# Patient Record
Sex: Male | Born: 1985 | Race: White | Hispanic: No | Marital: Married | Smoking: Never smoker
Health system: Southern US, Community
[De-identification: ages and names within clinical notes are randomized; demographics above are authoritative.]

## PROBLEM LIST (undated history)

## (undated) DIAGNOSIS — T7840XA Allergy, unspecified, initial encounter: Secondary | ICD-10-CM

## (undated) HISTORY — DX: Allergy, unspecified, initial encounter: T78.40XA

## (undated) HISTORY — PX: ROTATOR CUFF REPAIR: SHX139

---

## 2017-03-28 ENCOUNTER — Other Ambulatory Visit: Payer: Self-pay | Admitting: Family Medicine

## 2017-03-28 ENCOUNTER — Ambulatory Visit
Admission: RE | Admit: 2017-03-28 | Discharge: 2017-03-28 | Disposition: A | Discharge status: Home / Self Care | Payer: Managed Care, Other (non HMO) | Source: Ambulatory Visit | Attending: Family Medicine | Admitting: Family Medicine

## 2017-03-28 DIAGNOSIS — M5416 Radiculopathy, lumbar region: Secondary | ICD-10-CM

## 2018-06-04 ENCOUNTER — Ambulatory Visit (HOSPITAL_COMMUNITY)
Admission: EM | Admit: 2018-06-04 | Discharge: 2018-06-04 | Disposition: A | Discharge status: Home / Self Care | Payer: Managed Care, Other (non HMO) | Attending: Physician Assistant | Admitting: Physician Assistant

## 2018-06-04 ENCOUNTER — Encounter (HOSPITAL_COMMUNITY): Payer: Self-pay

## 2018-06-04 DIAGNOSIS — M79672 Pain in left foot: Secondary | ICD-10-CM

## 2018-06-04 MED ORDER — PREDNISONE 50 MG PO TABS: 50.0000 mg | ORAL_TABLET | Freq: Every day | ORAL | 0 refills | Status: DC

## 2018-06-04 NOTE — ED Provider Notes (Signed)
[facility]    CSN: [id number] Arrival date & time: 06/04/18  [id number]     History   Chief Complaint Chief Complaint  Patient presents with  . Foot Pain    right foot pain     HPI Kevin NajjarBrian Moon is a 32 y.o. male.   32 year old male comes in for 2 day history of left foot pain. States pain is to the plantar surface, around the arch. Denies pain at rest. Pain exacerbated by weight bearing and movement. Denies injury/trauma. States restarted running regimen recently. States yesterday, woke up with significant pain with weight bearing in the morning. Denies numbness/tingling. Ibuprofen 800mg  with some relief.      History reviewed. No pertinent past medical history.  There are no active problems to display for this patient.   History reviewed. No pertinent surgical history.     Home Medications    Prior to Admission medications   Medication Sig Start Date End Date Taking? Authorizing Provider  predniSONE (DELTASONE) 50 MG tablet Take 1 tablet (50 mg total) by mouth daily. 06/04/18   Belinda FisherYu, Myleah Cavendish V, PA-C    Family History History reviewed. No pertinent family history.  Social History Social History   Tobacco Use  . Smoking status: Not on file  Substance Use Topics  . Alcohol use: Not on file  . Drug use: Not on file     Allergies   Patient has no known allergies.   Review of Systems Review of Systems  Reason unable to perform ROS: See HPI as above.     Physical Exam Triage Vital Signs ED Triage Vitals  Enc Vitals Group     BP 06/04/18 1046 126/82     Pulse Rate 06/04/18 1046 64     Resp 06/04/18 1046 18     Temp 06/04/18 1046 98 F (36.7 C)     Temp Source 06/04/18 1046 Oral     SpO2 06/04/18 1046 100 %     Weight --      Height --      Head Circumference --      Peak Flow --      Pain Score 06/04/18 1049 7     Pain Loc --      Pain Edu? --      Excl. in GC? --    No data found.  Updated Vital Signs BP 126/82 (BP Location: Right  Arm)   Pulse 64   Temp 98 F (36.7 C) (Oral)   Resp 18   SpO2 100%    Physical Exam Constitutional:      General: He is not in acute distress.    Appearance: He is well-developed. He is not diaphoretic.  HENT:     Head: Normocephalic and atraumatic.  Eyes:     Conjunctiva/sclera: Conjunctivae normal.     Pupils: Pupils are equal, round, and reactive to light.  Musculoskeletal:     Comments: No swelling, erythema, warmth, contusion. Tenderness to palpation of plantar arch. Full ROM of ankle and toes. Symptoms worse with dorsiflexion. Strength normal and equal bilaterally. Sensation intact and equal bilaterally. Pedal pulse 2+, cap refill <2s  Neurological:     Mental Status: He is alert and oriented to person, place, and time.      UC Treatments / Results  Labs (all labs ordered are listed, but only abnormal results are displayed) Labs Reviewed - No data to display  EKG None  Radiology No results found.  Procedures  Procedures (including critical care time)  Medications Ordered in UC Medications - No data to display  Initial Impression / Assessment and Plan / UC Course  I have reviewed the triage vital signs and the nursing notes.  Pertinent labs & imaging results that were available during my care of the patient were reviewed by me and considered in my medical decision making (see chart for details).    Continue NSAIDs, ice compress, elevation, rest. Rx of prednisone provided, can fill if symptoms not improving with NSAIDs. Return precautions given.  Final Clinical Impressions(s) / UC Diagnoses   Final diagnoses:  Left foot pain    ED Prescriptions    Medication Sig Dispense Auth. Provider   predniSONE (DELTASONE) 50 MG tablet Take 1 tablet (50 mg total) by mouth daily. 5 tablet Threasa AlphaYu, Ova Gillentine V, PA-C        Fidel Caggiano V, [place] JerseyPA-C 06/04/18 1137

## 2018-06-04 NOTE — Discharge Instructions (Addendum)
Symptoms consistent with inflammation such as tendinitis/plantar fascitis. Continue ibuprofen 800mg  three times a day, ice compress, stretching out fascia with water bottle, rest. If symptoms not improving, start prednisone as directed. Follow up with orthopedics if symptoms still not improving.

## 2018-06-04 NOTE — ED Triage Notes (Signed)
Pt presents right foot pain that started on Sunday.  Pt denies any injury, he did go running on Saturday morning and on Sunday he woke up and was unable to walk on his right foot.

## 2018-12-16 ENCOUNTER — Emergency Department (HOSPITAL_COMMUNITY)
Admission: EM | Admit: 2018-12-16 | Discharge: 2018-12-16 | Disposition: A | Discharge status: Home / Self Care | Payer: Managed Care, Other (non HMO) | Attending: Emergency Medicine | Admitting: Emergency Medicine

## 2018-12-16 ENCOUNTER — Encounter (HOSPITAL_COMMUNITY): Payer: Self-pay | Admitting: Student

## 2018-12-16 ENCOUNTER — Other Ambulatory Visit: Payer: Self-pay

## 2018-12-16 ENCOUNTER — Emergency Department (HOSPITAL_COMMUNITY): Payer: Managed Care, Other (non HMO)

## 2018-12-16 DIAGNOSIS — R05 Cough: Secondary | ICD-10-CM | POA: Diagnosis not present

## 2018-12-16 DIAGNOSIS — U071 COVID-19: Secondary | ICD-10-CM | POA: Diagnosis not present

## 2018-12-16 DIAGNOSIS — B349 Viral infection, unspecified: Secondary | ICD-10-CM

## 2018-12-16 DIAGNOSIS — M7918 Myalgia, other site: Secondary | ICD-10-CM | POA: Diagnosis not present

## 2018-12-16 DIAGNOSIS — R509 Fever, unspecified: Secondary | ICD-10-CM | POA: Diagnosis present

## 2018-12-16 MED ORDER — BENZONATATE 100 MG PO CAPS: 100.0000 mg | ORAL_CAPSULE | Freq: Three times a day (TID) | ORAL | 0 refills | Status: DC

## 2018-12-16 MED ORDER — FLUTICASONE PROPIONATE 50 MCG/ACT NA SUSP: 1.0000 | Freq: Every day | NASAL | 0 refills | Status: DC

## 2018-12-16 MED ORDER — ACETAMINOPHEN ER 650 MG PO TBCR: 650.0000 mg | EXTENDED_RELEASE_TABLET | Freq: Three times a day (TID) | ORAL | 0 refills | Status: DC | PRN

## 2018-12-16 NOTE — ED Provider Notes (Signed)
[facility] Provider Note   CSN: [id number] Arrival date & time: 12/16/18  0840     History   Chief Complaint Chief Complaint  Patient presents with  . Fever    HPI Kevin Moon is a 33 y.o. male without significant past medical hx who presents to the ED w/ complaints of fever that began this AM. Patient states he started to feel poorly yesterday w/ generalized body aches & nasal congestion, then this AM he woke up early this morning with dry cough & feeling very hot. He took his temperature and it was 102. Took tylenol w/ improvement of fever, no other alleviating/aggravating factors.  Denies chest pain, dyspnea, ear pain, sore throat, N/V/D, or abdominal pain. He was with family 4th of July weekend & since then 3 family members have been sick w/ similar sxs & tested positive for Covid 19. He denies having any medical problems or use of tobacco products.      HPI  History reviewed. No pertinent past medical history.  There are no active problems to display for this patient.   History reviewed. No pertinent surgical history.      Home Medications    Prior to Admission medications   Medication Sig Start Date End Date Taking? Authorizing Provider  predniSONE (DELTASONE) 50 MG tablet Take 1 tablet (50 mg total) by mouth daily. 06/04/18   Ok Edwards, PA-C    Family History History reviewed. No pertinent family history.  Social History Social History   Tobacco Use  . Smoking status: Never Smoker  . Smokeless tobacco: Never Used  Substance Use Topics  . Alcohol use: Yes  . Drug use: Not Currently     Allergies   Patient has no known allergies.   Review of Systems Review of Systems  Constitutional: Positive for chills and fever.  HENT: Positive for congestion. Negative for ear pain and sore throat.   Respiratory: Positive for cough. Negative for shortness of breath.   Cardiovascular: Negative for chest pain and leg swelling.   Gastrointestinal: Negative for abdominal pain, diarrhea, nausea and vomiting.  Musculoskeletal: Positive for myalgias (generalized).  Skin: Negative for rash.  All other systems reviewed and are negative.    Physical Exam Updated Vital Signs BP 126/86 (BP Location: Right Arm)   Pulse 95   Temp 98.2 F (36.8 C)   Resp 18   Ht 5\' 10"  (1.778 m)   Wt 111.1 kg   SpO2 95%   BMI 35.15 kg/m   Physical Exam Vitals signs and nursing note reviewed.  Constitutional:      General: He is not in acute distress.    Appearance: He is well-developed. He is not toxic-appearing.  HENT:     Head: Normocephalic and atraumatic.     Right Ear: Tympanic membrane, ear canal and external ear normal. Tympanic membrane is not perforated, erythematous, retracted or bulging.     Left Ear: Tympanic membrane, ear canal and external ear normal. Tympanic membrane is not perforated, erythematous, retracted or bulging.     Ears:     Comments: No mastoid erythema/swelling/tenderness.     Nose: Congestion present.     Right Sinus: No maxillary sinus tenderness or frontal sinus tenderness.     Left Sinus: No maxillary sinus tenderness or frontal sinus tenderness.     Mouth/Throat:     Mouth: Mucous membranes are moist.     Pharynx: Oropharynx is clear. Uvula midline. No oropharyngeal exudate or posterior  oropharyngeal erythema.     Comments: Posterior oropharynx is symmetric appearing. Patient tolerating own secretions without difficulty. No trismus. No drooling. No hot potato voice. No swelling beneath the tongue, submandibular compartment is soft.  Eyes:     General:        Right eye: No discharge.        Left eye: No discharge.     Conjunctiva/sclera: Conjunctivae normal.  Neck:     Musculoskeletal: Neck supple. No edema, neck rigidity or crepitus.  Cardiovascular:     Rate and Rhythm: Normal rate and regular rhythm.     Comments: HR 85-90 bpm Pulmonary:     Effort: Pulmonary effort is normal. No  tachypnea or respiratory distress.     Breath sounds: Decreased air movement (R base) present. Rhonchi (R base) present. No wheezing or rales.     Comments: SpO2 97-100% on RA Abdominal:     General: There is no distension.     Palpations: Abdomen is soft.     Tenderness: There is no abdominal tenderness.  Musculoskeletal:        General: No tenderness.     Right lower leg: No edema.     Left lower leg: No edema.  Lymphadenopathy:     Cervical: No cervical adenopathy.  Skin:    General: Skin is warm and dry.     Findings: No rash.  Neurological:     Mental Status: He is alert.     Comments: Clear speech.   Psychiatric:        Behavior: Behavior normal.    ED Treatments / Results  Labs (all labs ordered are listed, but only abnormal results are displayed) Labs Reviewed  NOVEL CORONAVIRUS, NAA (HOSPITAL ORDER, SEND-OUT TO REF LAB)    EKG None  Radiology No results found.  Procedures Procedures (including critical care time)  Medications Ordered in ED Medications - No data to display   Initial Impression / Assessment and Plan / ED Course  I have reviewed the triage vital signs and the nursing notes.  Pertinent labs & imaging results that were available during my care of the patient were reviewed by me and considered in my medical decision making (see chart for details).   Patient presents to the ED w/ sxs concerning for covid 19 in setting of + sick contacts w/ similar sxs that have tested positive for covid. Patient is nontoxic appearing, no apparent distress, vitals WNL. On exam he has some mildly decreased breath sounds w/ rhonchi @ the R bases- CXR obtained negative for infiltrate to suggest bacterial pneumonia. No sinus tenderness, sxs < 7 days, doubt acute bacterial sinusitis. Oropharynx & Ears w/ benign exam. No meningismus. No tic exposure or rashes to raise concern for tic born illness. No wheezing or respiratory distress. I personally ambulated patient w/ SpO2  maintaining 96-100% on RA. He has been tested for covid 19, discussed quarantine precautions, will provide symptomatic care, appears appropriate for discharge home. I discussed results, treatment plan, need for follow-up, and return precautions with the patient. Provided opportunity for questions, patient confirmed understanding and is in agreement with plan.    Final Clinical Impressions(s) / ED Diagnoses   Final diagnoses:  Viral illness    ED Discharge Orders         Ordered    fluticasone (FLONASE) 50 MCG/ACT nasal spray  Daily     12/16/18 1013    benzonatate (TESSALON) 100 MG capsule  Every 8 hours  12/16/18 1013    acetaminophen (TYLENOL 8 HOUR) 650 MG CR tablet  Every 8 hours PRN     12/16/18 1013           Milon Dethloff, [place]achtree CornersSamantha R, PA-C 12/16/18 1020    Raeford RazorKohut, Stephen, MD 12/16/18 1144

## 2018-12-16 NOTE — ED Triage Notes (Signed)
Pt. Has been having fever with congestion, body aches since yesterday. He was in [facility] over the 4th . My sister, father, and mother and sister were tested and the father, and sister have tested positive.

## 2018-12-16 NOTE — Discharge Instructions (Addendum)
You were seen in the emergency department today for symptoms consistent with a viral illness.  We are concerned that you likely have coronavirus given your family has had positive testing with similar symptoms.  Your chest x-ray was normal.  We are sending you home with the following medicines.your symptoms: -Flonase: This is a medicine to help with congestion, use 1 spray per nostril daily -Tessalon: This is a medicine to help with cough, take every 8 hours as needed for coughing -Tylenol: This medicine to help with fever and pain, take every 8 hours as needed.  We have prescribed you new medication(s) today. Discuss the medications prescribed today with your pharmacist as they can have adverse effects and interactions with your other medicines including over the counter and prescribed medications. Seek medical evaluation if you start to experience new or abnormal symptoms after taking one of these medicines, seek care immediately if you start to experience difficulty breathing, feeling of your throat closing, facial swelling, or rash as these could be indications of a more serious allergic reaction  We have tested you for coronavirus, we are instructing patients with coronavirus to quarantine themselves for 14 days. You may be able to discontinue self quarantine if the following conditions are met:   Persons with COVID-19 who have symptoms and were directed to care for themselves at home may discontinue home isolation under the  following conditions: - It has been at least 7 days have passed since symptoms first appeared. - AND at least 3 days (72 hours) have passed since recovery defined as resolution of fever without the use of fever-reducing medications and improvement in respiratory symptoms (e.g., cough, shortness of breath)  Please follow the below quarantine instructions.   Please follow up with primary care within 3-5 days for re-evaluation- call prior to going to the office to make them  aware of your symptoms. Return to the ER for new or worsening symptoms including but not limited to increased work of breathing, chest pain, passing out, or any other concerns.       Person Under Monitoring Name: Kevin Moon  Location: [place] [address]   Infection Prevention Recommendations for Individuals Confirmed to have, or Being Evaluated for, 2019 Novel Coronavirus (COVID-19) Infection Who Receive Care at Home  Individuals who are confirmed to have, or are being evaluated for, COVID-19 should follow the prevention steps below until a healthcare provider or local or state health department says they can return to normal activities.  Stay home except to get medical care You should restrict activities outside your home, except for getting medical care. Do not go to work, school, or public areas, and do not use public transportation or taxis.  Call ahead before visiting your doctor Before your medical appointment, call the healthcare provider and tell them that you have, or are being evaluated for, COVID-19 infection. This will help the healthcare providers office take steps to keep other people from getting infected. Ask your healthcare provider to call the local or state health department.  Monitor your symptoms Seek prompt medical attention if your illness is worsening (e.g., difficulty breathing). Before going to your medical appointment, call the healthcare provider and tell them that you have, or are being evaluated for, COVID-19 infection. Ask your healthcare provider to call the local or state health department.  Wear a facemask You should wear a facemask that covers your nose and mouth when you are in the same room with other people and when you  visit a healthcare provider. People who live with or visit you should also wear a facemask while they are in the same room with you.  Separate yourself from other people in your home As much as  possible, you should stay in a different room from other people in your home. Also, you should use a separate bathroom, if available.  Avoid sharing household items You should not share dishes, drinking glasses, cups, eating utensils, towels, bedding, or other items with other people in your home. After using these items, you should wash them thoroughly with soap and water.  Cover your coughs and sneezes Cover your mouth and nose with a tissue when you cough or sneeze, or you can cough or sneeze into your sleeve. Throw used tissues in a lined trash can, and immediately wash your hands with soap and water for at least 20 seconds or use an alcohol-based hand rub.  Wash your [facility] your hands often and thoroughly with soap and water for at least 20 seconds. You can use an alcohol-based hand sanitizer if soap and water are not available and if your hands are not visibly dirty. Avoid touching your eyes, nose, and mouth with unwashed hands.   Prevention Steps for Caregivers and Household Members of Individuals Confirmed to have, or Being Evaluated for, COVID-19 Infection Being Cared for in the Home  If you live with, or provide care at home for, a person confirmed to have, or being evaluated for, COVID-19 infection please follow these guidelines to prevent infection:  Follow healthcare providers instructions Make sure that you understand and can help the patient follow any healthcare provider instructions for all care.  Provide for the patients basic needs You should help the patient with basic needs in the home and provide support for getting groceries, prescriptions, and other personal needs.  Monitor the patients symptoms If they are getting sicker, call his or her medical provider and tell them that the patient has, or is being evaluated for, COVID-19 infection. This will help the healthcare providers office take steps to keep other people from getting infected. Ask the  healthcare provider to call the local or state health department.  Limit the number of people who have contact with the patient If possible, have only one caregiver for the patient. Other household members should stay in another home or place of residence. If this is not possible, they should stay in another room, or be separated from the patient as much as possible. Use a separate bathroom, if available. Restrict visitors who do not have an essential need to be in the home.  Keep older adults, very young children, and other sick people away from the patient Keep older adults, very young children, and those who have compromised immune systems or chronic health conditions away from the patient. This includes people with chronic heart, lung, or kidney conditions, diabetes, and cancer.  Ensure good ventilation Make sure that shared spaces in the home have good air flow, such as from an air conditioner or an opened window, weather permitting.  Wash your hands often Wash your hands often and thoroughly with soap and water for at least 20 seconds. You can use an alcohol based hand sanitizer if soap and water are not available and if your hands are not visibly dirty. Avoid touching your eyes, nose, and mouth with unwashed hands. Use disposable paper towels to dry your hands. If not available, use dedicated cloth towels and replace them when they become wet.  Wear a facemask and gloves Wear a disposable facemask at all times in the room and gloves when you touch or have contact with the patients blood, body fluids, and/or secretions or excretions, such as sweat, saliva, sputum, nasal mucus, vomit, urine, or feces.  Ensure the mask fits over your nose and mouth tightly, and do not touch it during use. Throw out disposable facemasks and gloves after using them. Do not reuse. Wash your hands immediately after removing your facemask and gloves. If your personal clothing becomes contaminated, carefully  remove clothing and launder. Wash your hands after handling contaminated clothing. Place all used disposable facemasks, gloves, and other waste in a lined container before disposing them with other household waste. Remove gloves and wash your hands immediately after handling these items.  Do not share dishes, glasses, or other household items with the patient Avoid sharing household items. You should not share dishes, drinking glasses, cups, eating utensils, towels, bedding, or other items with a patient who is confirmed to have, or being evaluated for, COVID-19 infection. After the person uses these items, you should wash them thoroughly with soap and water.  Wash laundry thoroughly Immediately remove and wash clothes or bedding that have blood, body fluids, and/or secretions or excretions, such as sweat, saliva, sputum, nasal mucus, vomit, urine, or feces, on them. Wear gloves when handling laundry from the patient. Read and follow directions on labels of laundry or clothing items and detergent. In general, wash and dry with the warmest temperatures recommended on the label.  Clean all areas the individual has used often Clean all touchable surfaces, such as counters, tabletops, doorknobs, bathroom fixtures, toilets, phones, keyboards, tablets, and bedside tables, every day. Also, clean any surfaces that may have blood, body fluids, and/or secretions or excretions on them. Wear gloves when cleaning surfaces the patient has come in contact with. Use a diluted bleach solution (e.g., dilute bleach with 1 part bleach and 10 parts water) or a household disinfectant with a label that says EPA-registered for coronaviruses. To make a bleach solution at home, add 1 tablespoon of bleach to 1 quart (4 cups) of water. For a larger supply, add  cup of bleach to 1 gallon (16 cups) of water. Read labels of cleaning products and follow recommendations provided on product labels. Labels contain instructions for  safe and effective use of the cleaning product including precautions you should take when applying the product, such as wearing gloves or eye protection and making sure you have good ventilation during use of the product. Remove gloves and wash hands immediately after cleaning.  Monitor yourself for signs and symptoms of illness Caregivers and household members are considered close contacts, should monitor their health, and will be asked to limit movement outside of the home to the extent possible. Follow the monitoring steps for close contacts listed on the symptom monitoring form.   ? If you have additional questions, contact your local health department or call the epidemiologist on call at [phone] (available 24/7). ? This guidance is subject to change. For the most up-to-date guidance from [facility], please refer to their website: [email or web address]

## 2018-12-18 LAB — NOVEL CORONAVIRUS, NAA (HOSP ORDER, SEND-OUT TO REF LAB; TAT 18-24 HRS): SARS-CoV-2, NAA: DETECTED — AB

## 2018-12-19 ENCOUNTER — Emergency Department (HOSPITAL_COMMUNITY)
Admission: EM | Admit: 2018-12-19 | Discharge: 2018-12-19 | Disposition: A | Discharge status: Home / Self Care | Payer: Managed Care, Other (non HMO) | Attending: Emergency Medicine | Admitting: Emergency Medicine

## 2018-12-19 ENCOUNTER — Emergency Department (HOSPITAL_COMMUNITY): Payer: Managed Care, Other (non HMO)

## 2018-12-19 ENCOUNTER — Other Ambulatory Visit: Payer: Self-pay

## 2018-12-19 DIAGNOSIS — J1289 Other viral pneumonia: Secondary | ICD-10-CM | POA: Insufficient documentation

## 2018-12-19 DIAGNOSIS — R05 Cough: Secondary | ICD-10-CM | POA: Diagnosis not present

## 2018-12-19 DIAGNOSIS — R51 Headache: Secondary | ICD-10-CM | POA: Diagnosis not present

## 2018-12-19 DIAGNOSIS — U071 COVID-19: Secondary | ICD-10-CM | POA: Insufficient documentation

## 2018-12-19 DIAGNOSIS — R0602 Shortness of breath: Secondary | ICD-10-CM | POA: Diagnosis present

## 2018-12-19 DIAGNOSIS — J1282 Pneumonia due to coronavirus disease 2019: Secondary | ICD-10-CM

## 2018-12-19 LAB — CBC WITH DIFFERENTIAL/PLATELET
Abs Immature Granulocytes: 0.03 10*3/uL (ref 0.00–0.07)
Basophils Absolute: 0 10*3/uL (ref 0.0–0.1)
Basophils Relative: 0 %
Eosinophils Absolute: 0 10*3/uL (ref 0.0–0.5)
Eosinophils Relative: 1 %
HCT: 48.8 % (ref 39.0–52.0)
Hemoglobin: 16.1 g/dL (ref 13.0–17.0)
Immature Granulocytes: 0 %
Lymphocytes Relative: 14 %
Lymphs Abs: 1 10*3/uL (ref 0.7–4.0)
MCH: 30.1 pg (ref 26.0–34.0)
MCHC: 33 g/dL (ref 30.0–36.0)
MCV: 91.4 fL (ref 80.0–100.0)
Monocytes Absolute: 0.5 10*3/uL (ref 0.1–1.0)
Monocytes Relative: 7 %
Neutro Abs: 5.3 10*3/uL (ref 1.7–7.7)
Neutrophils Relative %: 78 %
Platelets: 114 10*3/uL — ABNORMAL LOW (ref 150–400)
RBC: 5.34 MIL/uL (ref 4.22–5.81)
RDW: 12.4 % (ref 11.5–15.5)
WBC: 6.8 10*3/uL (ref 4.0–10.5)
nRBC: 0 % (ref 0.0–0.2)

## 2018-12-19 LAB — COMPREHENSIVE METABOLIC PANEL
ALT: 12 U/L (ref 0–44)
AST: 25 U/L (ref 15–41)
Albumin: 3.8 g/dL (ref 3.5–5.0)
Alkaline Phosphatase: 63 U/L (ref 38–126)
Anion gap: 12 (ref 5–15)
BUN: 8 mg/dL (ref 6–20)
CO2: 24 mmol/L (ref 22–32)
Calcium: 9.1 mg/dL (ref 8.9–10.3)
Chloride: 101 mmol/L (ref 98–111)
Creatinine, Ser: 1.08 mg/dL (ref 0.61–1.24)
GFR calc Af Amer: >60 mL/min (ref >60)
GFR calc non Af Amer: >60 mL/min (ref >60)
Glucose, Bld: 98 mg/dL (ref 70–99)
Potassium: 4.7 mmol/L (ref 3.5–5.1)
Sodium: 137 mmol/L (ref 135–145)
Total Bilirubin: 0.3 mg/dL (ref 0.3–1.2)
Total Protein: 7 g/dL (ref 6.5–8.1)

## 2018-12-19 MED ORDER — AZITHROMYCIN 250 MG PO TABS
500.0000 mg | ORAL_TABLET | Freq: Once | ORAL | Status: AC
  Administered 2018-12-19: 500 mg via ORAL
  Filled 2018-12-19: qty 2

## 2018-12-19 MED ORDER — ACETAMINOPHEN 325 MG PO TABS
650.0000 mg | ORAL_TABLET | Freq: Once | ORAL | Status: AC
  Administered 2018-12-19: 650 mg via ORAL
  Filled 2018-12-19: qty 2

## 2018-12-19 MED ORDER — AZITHROMYCIN 250 MG PO TABS: 250.0000 mg | ORAL_TABLET | Freq: Every day | ORAL | 0 refills | Status: AC

## 2018-12-19 MED ORDER — DIPHENHYDRAMINE HCL 50 MG/ML IJ SOLN
25.0000 mg | Freq: Once | INTRAMUSCULAR | Status: AC
  Administered 2018-12-19: 25 mg via INTRAVENOUS
  Filled 2018-12-19: qty 1

## 2018-12-19 MED ORDER — SODIUM CHLORIDE 0.9 % IV BOLUS
500.0000 mL | Freq: Once | INTRAVENOUS | Status: AC
  Administered 2018-12-19: 500 mL via INTRAVENOUS

## 2018-12-19 MED ORDER — IOHEXOL 350 MG/ML SOLN
100.0000 mL | Freq: Once | INTRAVENOUS | Status: AC | PRN
  Administered 2018-12-19: 100 mL via INTRAVENOUS

## 2018-12-19 MED ORDER — METOCLOPRAMIDE HCL 5 MG/ML IJ SOLN
10.0000 mg | Freq: Once | INTRAMUSCULAR | Status: AC
  Administered 2018-12-19: 10 mg via INTRAVENOUS
  Filled 2018-12-19: qty 2

## 2018-12-19 NOTE — ED Provider Notes (Addendum)
[facility] EMERGENCY DEPARTMENT Provider Note   CSN: [id number] Arrival date & time: 12/19/18  1117    History   Chief Complaint Chief Complaint  Patient presents with   Chest Pain    HPI Kevin Moon is a 33 y.o. male.     33 y.o male with no pertinent medical history presents to the ED via EMS with a chief complaint of shortness of breath, chest pain, myalgias. Patient was diagnosed with Covid 19 three days ago on 12/16/2018, he was given a prescription for tylenol, flonase along with tessalon pearls. He reports worsening symptoms, along with shortness of breath and a productive cough. He has been taking tylenol as scheduled but reports worsening myalgias. Patient family who he spent fourth of July with has also tested positive and are currently under hospitalization. He endorses a headache, no focal point of tenderness, no photophobia.  No prior history of Asthma, blood clots or previous CAD.   The history is provided by the patient and medical records.  Chest Pain Associated symptoms: cough and shortness of breath   Associated symptoms: no abdominal pain, no back pain, no fever, no palpitations and no vomiting       Home Medications    Prior to Admission medications   Medication Sig Start Date End Date Taking? Authorizing Provider  acetaminophen (TYLENOL 8 HOUR) 650 MG CR tablet Take 1 tablet (650 mg total) by mouth every 8 (eight) hours as needed for pain or fever. 12/16/18   Petrucelli, Samantha R, PA-C  azithromycin (ZITHROMAX) 250 MG tablet Take 1 tablet (250 mg total) by mouth daily for 5 days. Take first 2 tablets together, then 1 every day until finished. 12/19/18 12/24/18  Janeece Fitting, PA-C  benzonatate (TESSALON) 100 MG capsule Take 1 capsule (100 mg total) by mouth every 8 (eight) hours. 12/16/18   Petrucelli, Samantha R, PA-C  fluticasone (FLONASE) 50 MCG/ACT nasal spray Place 1 spray into both nostrils daily. 12/16/18   Petrucelli, Samantha R, PA-C   ibuprofen (ADVIL) 200 MG tablet Take 200 mg by mouth every 6 (six) hours as needed for fever.    [provider]  pseudoephedrine (SUDAFED) 120 MG 12 hr tablet Take 120 mg by mouth every 12 (twelve) hours as needed for congestion.    [provider]  vitamin C (ASCORBIC ACID) 500 MG tablet Take 1,000 mg by mouth daily.    [provider]    Family History No family history on file.  Social History Social History   Tobacco Use   Smoking status: Never Smoker   Smokeless tobacco: Never Used  Substance Use Topics   Alcohol use: Yes   Drug use: Not Currently     Allergies   Patient has no known allergies.   Review of Systems Review of Systems  Constitutional: Negative for chills and fever.  HENT: Negative for ear pain and sore throat.   Eyes: Negative for pain and visual disturbance.  Respiratory: Positive for cough and shortness of breath. Negative for wheezing.   Cardiovascular: Positive for chest pain. Negative for palpitations.  Gastrointestinal: Positive for diarrhea. Negative for abdominal pain and vomiting.  Genitourinary: Negative for dysuria and hematuria.  Musculoskeletal: Negative for arthralgias and back pain.  Skin: Negative for color change and rash.  Neurological: Negative for seizures and syncope.  All other systems reviewed and are negative.    Physical Exam Updated Vital Signs BP 127/80    Pulse 99    Temp 99.9 F (  37.7 C) (Oral)    Resp (!) 22    SpO2 96%   Physical Exam Vitals signs and nursing note reviewed.  Constitutional:      Appearance: He is well-developed.  HENT:     Head: Normocephalic and atraumatic.  Eyes:     General: No scleral icterus.    Pupils: Pupils are equal, round, and reactive to light.  Neck:     Musculoskeletal: Normal range of motion.  Cardiovascular:     Heart sounds: Normal heart sounds.  Pulmonary:     Effort: Pulmonary effort is normal.     Breath sounds: Examination of the  right-lower field reveals decreased breath sounds. Examination of the left-lower field reveals decreased breath sounds. Decreased breath sounds present. No wheezing.  Chest:     Chest wall: No tenderness.  Abdominal:     General: Bowel sounds are normal. There is no distension.     Palpations: Abdomen is soft.     Tenderness: There is no abdominal tenderness.  Musculoskeletal:        General: No tenderness or deformity.  Skin:    General: Skin is warm and dry.  Neurological:     Mental Status: He is alert and oriented to person, place, and time.      ED Treatments / Results  Labs (all labs ordered are listed, but only abnormal results are displayed) Labs Reviewed  CBC WITH DIFFERENTIAL/PLATELET - Abnormal; Notable for the following components:      Result Value   Platelets 114 (*)    All other components within normal limits  COMPREHENSIVE METABOLIC PANEL    EKG EKG Interpretation  Date/Time:  Wednesday December 19 2018 11:20:18 EDT Ventricular Rate:  97 PR Interval:    QRS Duration: 87 QT Interval:  322 QTC Calculation: 409 R Axis:   -7 Text Interpretation:  Sinus rhythm Normal ECG Confirmed by Eber HongMiller, Wood ([id number]) on 12/19/2018 12:14:46 PM   Radiology Ct Angio Chest Pe W And/or Wo Contrast  Result Date: 12/19/2018 CLINICAL DATA:  Shortness of breath and chest pain EXAM: CT ANGIOGRAPHY CHEST WITH CONTRAST TECHNIQUE: Multidetector CT imaging of the chest was performed using the standard protocol during bolus administration of intravenous contrast. Multiplanar CT image reconstructions and MIPs were obtained to evaluate the vascular anatomy. CONTRAST:  100mL OMNIPAQUE IOHEXOL 350 MG/ML SOLN COMPARISON:  Chest radiograph December 19, 2018 FINDINGS: Cardiovascular: There is no demonstrable pulmonary embolus. There is no thoracic aortic aneurysm or dissection. Visualized great vessels appear unremarkable. There is no pericardial effusion or pericardial thickening. Mediastinum/Nodes:  Visualized thyroid appears unremarkable. No evident adenopathy. There is a small hiatal hernia. Lungs/Pleura: There is airspace consolidation throughout much of the right lower lobe and to a lesser degree in the left lower lobe. There is patchy infiltrate with mild consolidation in the inferior lingula. Patchy infiltrate is noted in portions of the right middle lobe, primarily laterally. There is also patchy infiltrate in each upper lobe, slightly more on the left than on the right. No appreciable pleural effusion. Upper Abdomen: Visualized upper abdominal structures appear unremarkable. Musculoskeletal: There are no blastic or lytic bone lesions. No chest wall lesions are evident. Review of the MIP images confirms the above findings. IMPRESSION: 1. No demonstrable pulmonary embolus. No thoracic aortic aneurysm or dissection. 2. Multifocal pneumonia, most severe in the right lower lobe present throughout most lobes and segments to varying degrees. 3.  No appreciable thoracic adenopathy. 4.  Small hiatal hernia. Electronically Signed   By:  Bretta BangWilliam  Woodruff III M.D.   On: 12/19/2018 14:38   Dg Chest Portable 1 View  Result Date: 12/19/2018 CLINICAL DATA:  Chest pain and shortness of breath, history of COVID-19 positivity EXAM: PORTABLE CHEST 1 VIEW COMPARISON:  12/16/2018 FINDINGS: Cardiac shadow is within normal limits. The lungs are well aerated bilaterally. Patchy infiltrates are seen particularly in the right lung base increased when compared with the prior exam. No bony abnormality is seen. IMPRESSION: Patchy infiltrates in the right lung. This would be consistent with the patient's given clinical history. Electronically Signed   By: Alcide CleverMark  Lukens M.D.   On: 12/19/2018 12:03    Procedures Procedures (including critical care time)  Medications Ordered in ED Medications  azithromycin (ZITHROMAX) tablet 500 mg (has no administration in time range)  sodium chloride 0.9 % bolus 500 mL (0 mLs Intravenous  Stopped 12/19/18 1228)  acetaminophen (TYLENOL) tablet 650 mg (650 mg Oral Given 12/19/18 1232)  metoCLOPramide (REGLAN) injection 10 mg (10 mg Intravenous Given 12/19/18 1232)  diphenhydrAMINE (BENADRYL) injection 25 mg (25 mg Intravenous Given 12/19/18 1232)  iohexol (OMNIPAQUE) 350 MG/ML injection 100 mL (100 mLs Intravenous Contrast Given 12/19/18 1414)     Initial Impression / Assessment and Plan / ED Course  I have reviewed the triage vital signs and the nursing notes.  Pertinent labs & imaging results that were available during my care of the patient were reviewed by me and considered in my medical decision making (see chart for details).     Patient with no past medical history presents to the ED with complaints of shortness of breath, mildly aches, body aches.  Patient was diagnosed with COVID-19 about 3 days ago.  Patient arrived in the ED with a low temp around 99.9. His pulse oxymetry is around 85% on room air, in no acute respiratory distress.  Mild tachypnea.  CBC showed no leukocytosis, hemoglobin is within normal limits.  CMP showed no electrolyte abnormality, kidney function is unremarkable.  LFTs are within normal limits.  Chest x-ray was remarkable for: Patchy infiltrates in the right lung. This would be consistent with  the patient's given clinical history.     Patient has been taking Tylenol for his fever, was given a 500 mL bolus while in the ED.  Patient's heart rate slightly increased, around 200s, tachycardia along with tachypnea and hypoxia differential diagnosis included but not limited to pneumonia, worsening COVID infection, pulmonary embolism.  Will obtain CT angios PE to rule out any further acute process:  1. No demonstrable pulmonary embolus. No thoracic aortic aneurysm or  dissection.    2. Multifocal pneumonia, most severe in the right lower lobe present  throughout most lobes and segments to varying degrees.    3. No appreciable thoracic adenopathy.      4. Small hiatal hernia.        3:11 PM Discussed results with patient, he will be going home on a short course of azithromycin.  Patient will be given his first dose of azithromycin while in the ED, encouraged to fill to begin antibiotics tomorrow. Patient was ambulated by nursing staff, he kept his oxygen saturation 96% throughout ambulation.  Discussed with patient risks and benefits over hospital admission.  Patient will attempt antibiotic therapy, encouraged to return if symptoms are unable to kept under control at home.  Patient understands and agrees with management.  Return precautions provided at length.   Kevin Moon was evaluated in Emergency Department on 12/19/2018 for the symptoms described in  the history of present illness. He was evaluated in the context of the global COVID-19 pandemic, which necessitated consideration that the patient might be at risk for infection with the SARS-CoV-2 virus that causes COVID-19. Institutional protocols and algorithms that pertain to the evaluation of patients at risk for COVID-19 are in a state of rapid change based on information released by regulatory bodies including the CDC and federal and state organizations. These policies and algorithms were followed during the patient's care in the ED.  Portions of this note were generated with Scientist, clinical (histocompatibility and immunogenetics)Dragon dictation software. Dictation errors may occur despite best attempts at proofreading.   Final Clinical Impressions(s) / ED Diagnoses   Final diagnoses:  Pneumonia due to COVID-19 virus    ED Discharge Orders         Ordered    azithromycin (ZITHROMAX) 250 MG tablet  Daily     12/19/18 1518           Claude MangesSoto, Zyquan Crotty, PA-C 12/19/18 1535    Eber HongMiller, Rolen, MD 12/20/18 0821    Claude MangesSoto, Aly Seidenberg, PA-C 01/28/19 [id number]    Eber HongMiller, [place], MD 01/29/19 [phone]

## 2018-12-19 NOTE — ED Notes (Signed)
Walked patient in room for about 2-3 min , 02 saturations stayed above 95% ; no s/s of distress noted , patient states "  I dont feel short of breath "

## 2018-12-19 NOTE — ED Triage Notes (Signed)
Pt here c/o increased Chest pain and SOB over the last day ; pt sates he was tested + covid 3 days ago; pt states he has a headache / cough/ and fever

## 2018-12-19 NOTE — Discharge Instructions (Addendum)
I have prescribed antibiotics to help treat your likely pneumonia, please begin these tomorrow. Take one tablet daily for the 5 days. You may continue taking tylenol for your fever along with increasing your fluids.

## 2018-12-19 NOTE — ED Notes (Signed)
Patient verbalizes understanding of discharge instructions. Opportunity for questioning and answering were provided. patient discharged from ED. Wife here to pick patient up

## 2019-07-15 ENCOUNTER — Ambulatory Visit: Payer: Managed Care, Other (non HMO) | Attending: Internal Medicine

## 2019-07-15 DIAGNOSIS — Z23 Encounter for immunization: Secondary | ICD-10-CM

## 2019-07-15 NOTE — Progress Notes (Signed)
   Covid-19 Vaccination Clinic  Name:  Kevin Moon    MRN: [id number] DOB: Feb 13, 1986  07/15/2019  Kevin Moon was observed post Covid-19 immunization for 15 minutes without incidence. He was provided with Vaccine Information Sheet and instruction to access the V-Safe system.   Kevin Moon was instructed to call 911 with any severe reactions post vaccine: Marland Kitchen Difficulty breathing  . Swelling of your face and throat  . A fast heartbeat  . A bad rash all over your body  . Dizziness and weakness    Immunizations Administered    Name Date Dose VIS Date Route   Pfizer COVID-19 Vaccine 07/15/2019  5:23 PM 0.3 mL 05/17/2019 Intramuscular   Manufacturer: [employer], [employer]   Lot: [id number]   NDC: [address]-5

## 2019-08-12 ENCOUNTER — Ambulatory Visit: Payer: Managed Care, Other (non HMO) | Attending: Internal Medicine

## 2019-08-12 DIAGNOSIS — Z23 Encounter for immunization: Secondary | ICD-10-CM

## 2019-08-12 NOTE — Progress Notes (Signed)
   Covid-19 Vaccination Clinic  Name:  Kevin Moon    MRN: [id number] DOB: [birth year]  08/12/2019  [name] was observed post Covid-19 immunization for 15 minutes without incident. He was provided with Vaccine Information Sheet and instruction to access the V-Safe system.   [name] was instructed to call 911 with any severe reactions post vaccine: Marland Kitchen Difficulty breathing  . Swelling of face and throat  . A fast heartbeat  . A bad rash all over body  . Dizziness and weakness   Immunizations Administered    Name Date Dose VIS Date Route   Pfizer COVID-19 Vaccine 08/12/2019  8:58 AM 0.3 mL 05/17/2019 Intramuscular   Manufacturer: [employer], [employer]   Lot: [id number]   NDC: [address]-8

## 2020-06-30 IMAGING — CT CT ANGIOGRAPHY CHEST
2 of 8 series · 18 of 46 positions shown · IV contrast (omnipaque)
Comparison: Chest radiograph December 19, 2018

CLINICAL DATA: Shortness of breath and chest pain

EXAM:
CT ANGIOGRAPHY CHEST WITH CONTRAST
TECHNIQUE: Multidetector CT imaging of the chest was performed using the
standard protocol during bolus administration of intravenous
contrast. Multiplanar CT image reconstructions and MIPs were
obtained to evaluate the vascular anatomy.
CONTRAST:  100mL OMNIPAQUE IOHEXOL 350 MG/ML SOLN

[Series 6: thins · axial · 0.70mm/px · z∈[+1057,+1265]mm · 15 of 230 slices shown]
[im 11/230  lung]
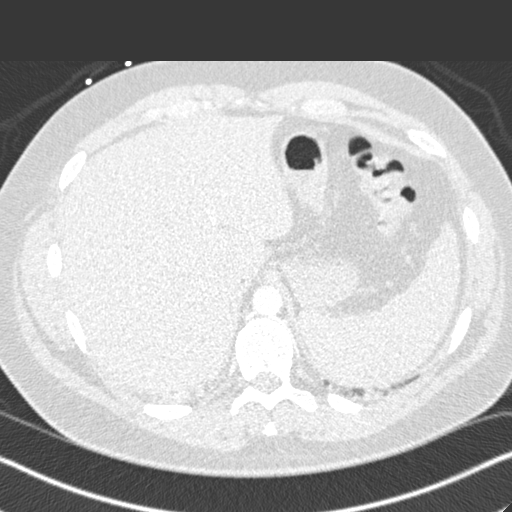
[im 32/230  soft-tissue]
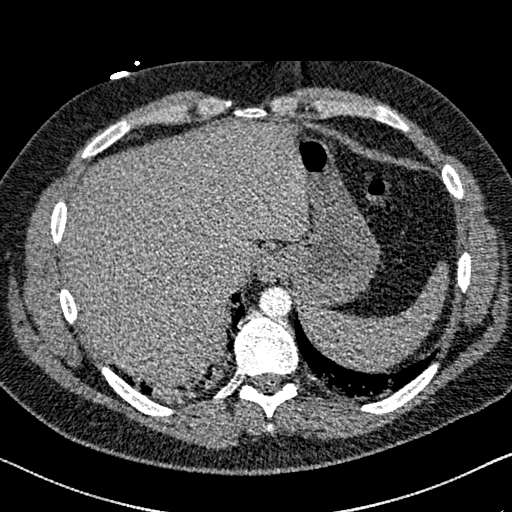
[im 42/230  lung]
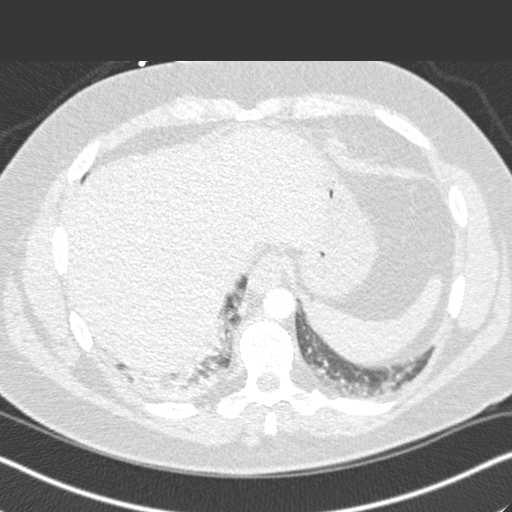
[im 53/230  soft-tissue]
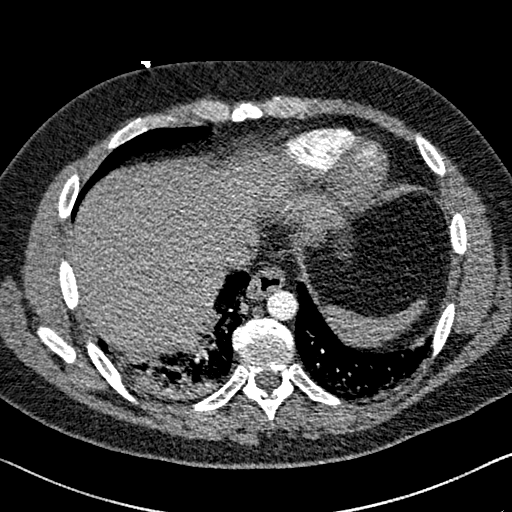
[im 73/230  lung]
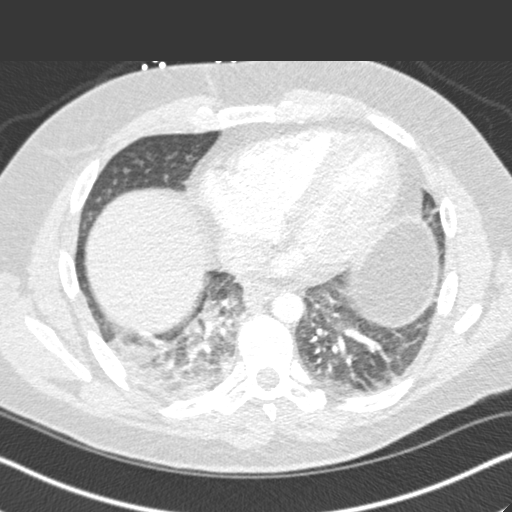
[im 84/230  soft-tissue]
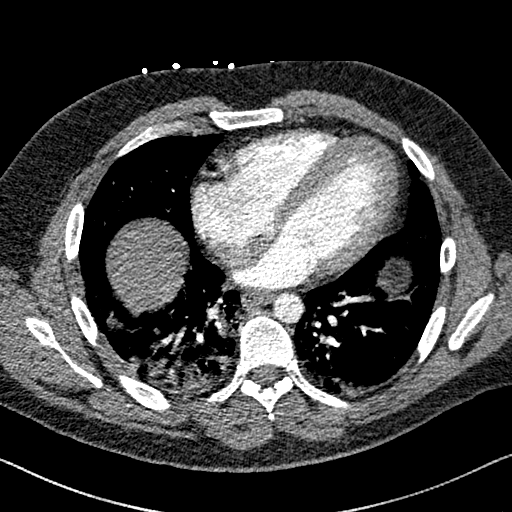
[im 105/230  lung]
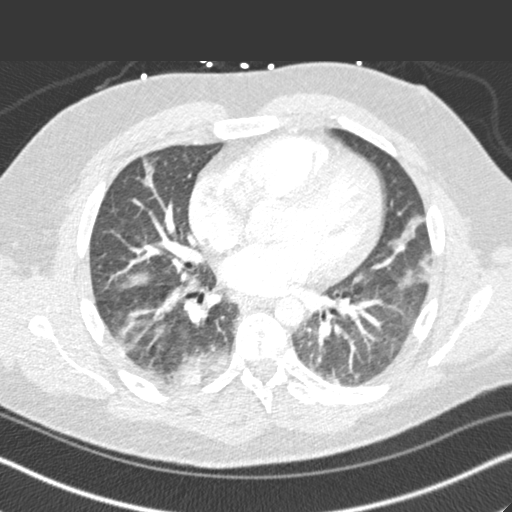
[im 115/230  soft-tissue]
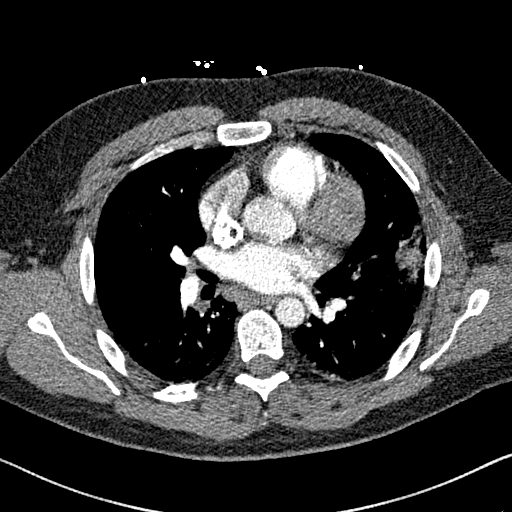
[im 125/230  lung]
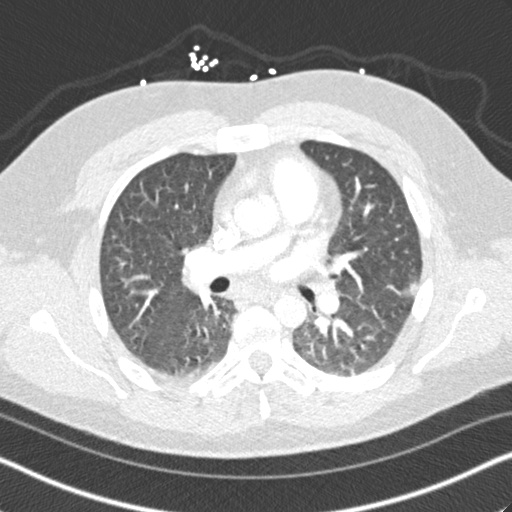
[im 146/230  soft-tissue]
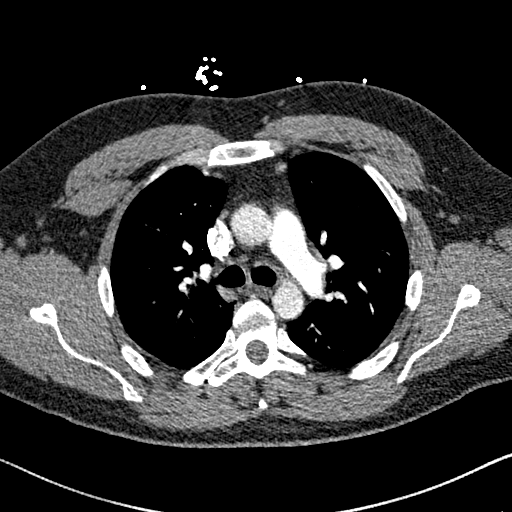
[im 157/230  lung]
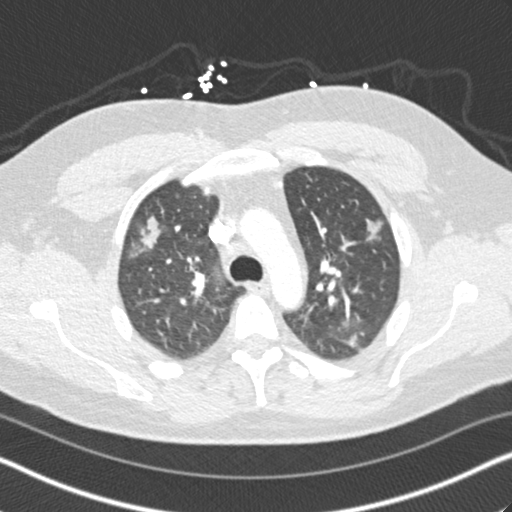
[im 177/230  soft-tissue]
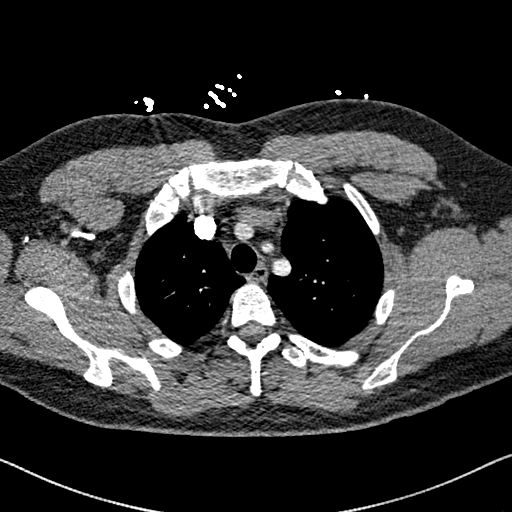
[im 188/230  lung]
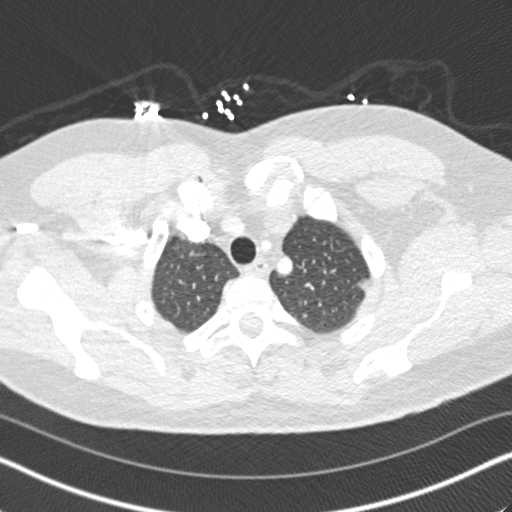
[im 198/230  soft-tissue]
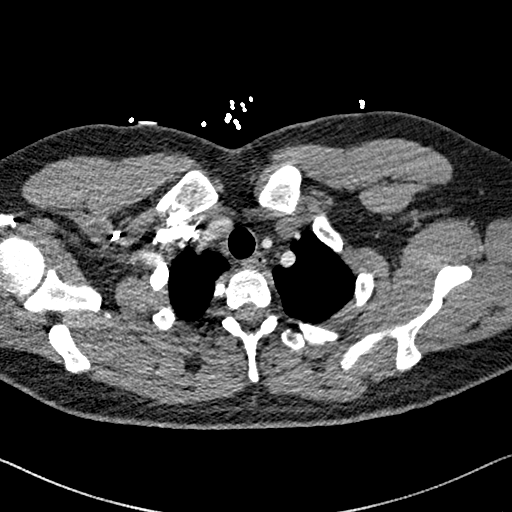
[im 219/230  lung]
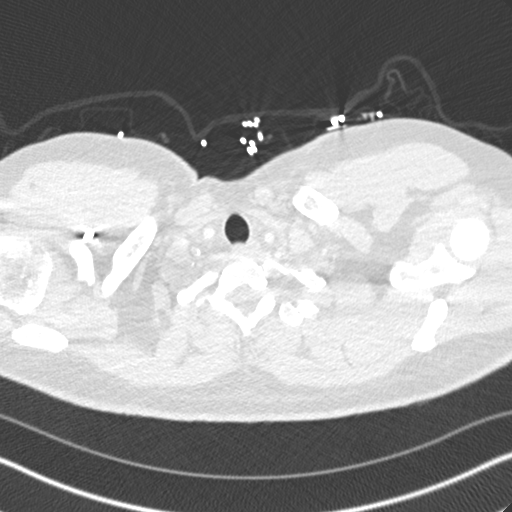

[Series 8: coronal mpr · coronal · 0.46mm/px · 3 of 132 slices shown]
[im 33/132  soft-tissue]
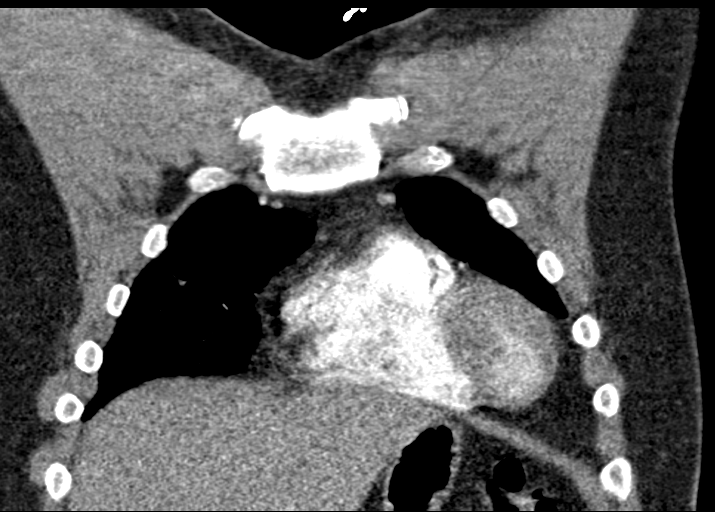
[im 66/132  soft-tissue]
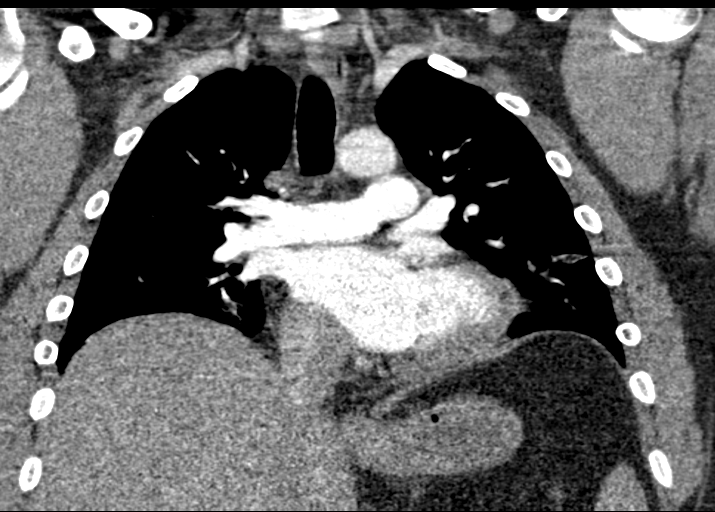
[im 99/132  soft-tissue]
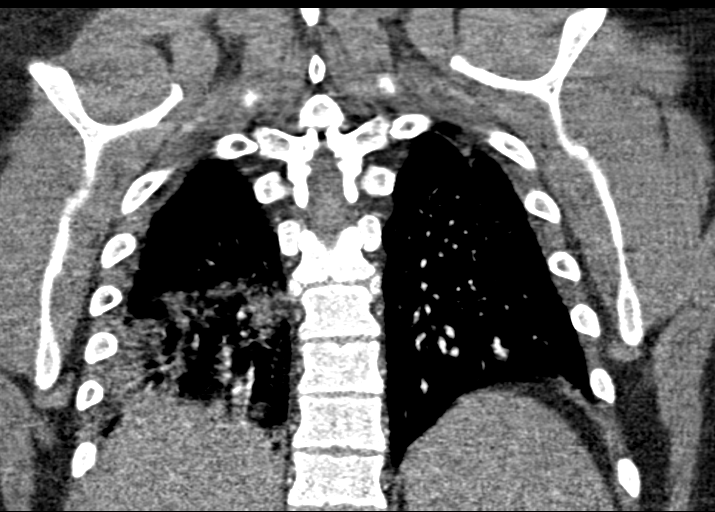

[18 of 46 positions shown; findings below may reference images not displayed]

FINDINGS: Cardiovascular: There is no demonstrable pulmonary embolus. There is
no thoracic aortic aneurysm or dissection. Visualized great vessels
appear unremarkable. There is no pericardial effusion or pericardial
thickening.

Mediastinum/Nodes: Visualized thyroid appears unremarkable. No
evident adenopathy. There is a small hiatal hernia.

Lungs/Pleura: There is airspace consolidation throughout much of the
right lower lobe and to a lesser degree in the left lower lobe.
There is patchy infiltrate with mild consolidation in the inferior
lingula. Patchy infiltrate is noted in portions of the right middle
lobe, primarily laterally. There is also patchy infiltrate in each
upper lobe, slightly more on the left than on the right. No
appreciable pleural effusion.

Upper Abdomen: Visualized upper abdominal structures appear
unremarkable.

Musculoskeletal: There are no blastic or lytic bone lesions. No
chest wall lesions are evident.

Review of the MIP images confirms the above findings.
IMPRESSION: 1. No demonstrable pulmonary embolus. No thoracic aortic aneurysm or
dissection.

2. Multifocal pneumonia, most severe in the right lower lobe present
throughout most lobes and segments to varying degrees.

3.  No appreciable thoracic adenopathy.

4.  Small hiatal hernia.

## 2021-04-16 DIAGNOSIS — M75101 Unspecified rotator cuff tear or rupture of right shoulder, not specified as traumatic: Secondary | ICD-10-CM | POA: Insufficient documentation

## 2021-06-24 ENCOUNTER — Other Ambulatory Visit (HOSPITAL_BASED_OUTPATIENT_CLINIC_OR_DEPARTMENT_OTHER): Payer: Self-pay

## 2021-06-24 MED ORDER — OZEMPIC (0.25 OR 0.5 MG/DOSE) 2 MG/1.5ML ~~LOC~~ SOPN
0.2500 mg | PEN_INJECTOR | SUBCUTANEOUS | 0 refills | Status: DC
  Filled 2021-06-24: qty 1.5, 42d supply, fill #0

## 2021-08-04 ENCOUNTER — Other Ambulatory Visit (HOSPITAL_BASED_OUTPATIENT_CLINIC_OR_DEPARTMENT_OTHER): Payer: Self-pay

## 2021-08-04 MED ORDER — OZEMPIC (0.25 OR 0.5 MG/DOSE) 2 MG/1.5ML ~~LOC~~ SOPN
PEN_INJECTOR | SUBCUTANEOUS | 0 refills | Status: DC
  Filled 2021-08-04: qty 1.5, 28d supply, fill #0

## 2021-09-02 ENCOUNTER — Other Ambulatory Visit (HOSPITAL_BASED_OUTPATIENT_CLINIC_OR_DEPARTMENT_OTHER): Payer: Self-pay

## 2021-09-02 MED ORDER — OZEMPIC (1 MG/DOSE) 4 MG/3ML ~~LOC~~ SOPN
PEN_INJECTOR | SUBCUTANEOUS | 0 refills | Status: DC
  Filled 2021-09-02: qty 3, 28d supply, fill #0

## 2021-09-09 ENCOUNTER — Encounter (HOSPITAL_BASED_OUTPATIENT_CLINIC_OR_DEPARTMENT_OTHER): Payer: Self-pay | Admitting: Emergency Medicine

## 2021-09-09 ENCOUNTER — Emergency Department (HOSPITAL_BASED_OUTPATIENT_CLINIC_OR_DEPARTMENT_OTHER)
Admission: EM | Admit: 2021-09-09 | Discharge: 2021-09-09 | Disposition: A | Discharge status: Home / Self Care | Payer: Managed Care, Other (non HMO) | Attending: Emergency Medicine | Admitting: Emergency Medicine

## 2021-09-09 ENCOUNTER — Other Ambulatory Visit: Payer: Self-pay

## 2021-09-09 ENCOUNTER — Emergency Department (HOSPITAL_BASED_OUTPATIENT_CLINIC_OR_DEPARTMENT_OTHER): Payer: Managed Care, Other (non HMO)

## 2021-09-09 DIAGNOSIS — R197 Diarrhea, unspecified: Secondary | ICD-10-CM | POA: Diagnosis present

## 2021-09-09 DIAGNOSIS — R7989 Other specified abnormal findings of blood chemistry: Secondary | ICD-10-CM | POA: Insufficient documentation

## 2021-09-09 DIAGNOSIS — D72829 Elevated white blood cell count, unspecified: Secondary | ICD-10-CM | POA: Insufficient documentation

## 2021-09-09 DIAGNOSIS — K529 Noninfective gastroenteritis and colitis, unspecified: Secondary | ICD-10-CM | POA: Insufficient documentation

## 2021-09-09 LAB — CBC
HCT: 53 % — ABNORMAL HIGH (ref 39.0–52.0)
Hemoglobin: 17.9 g/dL — ABNORMAL HIGH (ref 13.0–17.0)
MCH: 29.3 pg (ref 26.0–34.0)
MCHC: 33.8 g/dL (ref 30.0–36.0)
MCV: 86.7 fL (ref 80.0–100.0)
Platelets: 277 10*3/uL (ref 150–400)
RBC: 6.11 MIL/uL — ABNORMAL HIGH (ref 4.22–5.81)
RDW: 12.7 % (ref 11.5–15.5)
WBC: 20.7 10*3/uL — ABNORMAL HIGH (ref 4.0–10.5)
nRBC: 0 % (ref 0.0–0.2)

## 2021-09-09 LAB — LIPASE, BLOOD: Lipase: 79 U/L — ABNORMAL HIGH (ref 11–51)

## 2021-09-09 LAB — URINALYSIS, ROUTINE W REFLEX MICROSCOPIC
Bilirubin Urine: NEGATIVE
Glucose, UA: NEGATIVE mg/dL
Hgb urine dipstick: NEGATIVE
Leukocytes,Ua: NEGATIVE
Nitrite: NEGATIVE
Protein, ur: 30 mg/dL — AB
Specific Gravity, Urine: 1.035 — ABNORMAL HIGH (ref 1.005–1.030)
pH: 6 (ref 5.0–8.0)

## 2021-09-09 LAB — COMPREHENSIVE METABOLIC PANEL
ALT: 7 U/L (ref 0–44)
AST: 15 U/L (ref 15–41)
Albumin: 5.5 g/dL — ABNORMAL HIGH (ref 3.5–5.0)
Alkaline Phosphatase: 78 U/L (ref 38–126)
Anion gap: 15 (ref 5–15)
BUN: 16 mg/dL (ref 6–20)
CO2: 21 mmol/L — ABNORMAL LOW (ref 22–32)
Calcium: 10.6 mg/dL — ABNORMAL HIGH (ref 8.9–10.3)
Chloride: 101 mmol/L (ref 98–111)
Creatinine, Ser: 1.15 mg/dL (ref 0.61–1.24)
GFR, Estimated: >60 mL/min (ref >60)
Glucose, Bld: 127 mg/dL — ABNORMAL HIGH (ref 70–99)
Potassium: 3.3 mmol/L — ABNORMAL LOW (ref 3.5–5.1)
Sodium: 137 mmol/L (ref 135–145)
Total Bilirubin: 0.6 mg/dL (ref 0.3–1.2)
Total Protein: 9.1 g/dL — ABNORMAL HIGH (ref 6.5–8.1)

## 2021-09-09 MED ORDER — ONDANSETRON HCL 4 MG PO TABS
4.0000 mg | ORAL_TABLET | ORAL | 0 refills | Status: DC | PRN
  Filled 2021-09-10: qty 12, 2d supply, fill #0

## 2021-09-09 MED ORDER — LOPERAMIDE HCL 2 MG PO CAPS
4.0000 mg | ORAL_CAPSULE | Freq: Once | ORAL | Status: AC
  Administered 2021-09-09: 4 mg via ORAL
  Filled 2021-09-09: qty 2

## 2021-09-09 MED ORDER — ONDANSETRON HCL 4 MG/2ML IJ SOLN
4.0000 mg | Freq: Once | INTRAMUSCULAR | Status: AC
  Administered 2021-09-09: 4 mg via INTRAVENOUS
  Filled 2021-09-09: qty 2

## 2021-09-09 MED ORDER — ONDANSETRON 4 MG PO TBDP
4.0000 mg | ORAL_TABLET | Freq: Once | ORAL | Status: AC
  Administered 2021-09-09: 4 mg via ORAL
  Filled 2021-09-09: qty 1

## 2021-09-09 MED ORDER — FAMOTIDINE IN NACL 20-0.9 MG/50ML-% IV SOLN
20.0000 mg | Freq: Once | INTRAVENOUS | Status: AC
  Administered 2021-09-09: 20 mg via INTRAVENOUS
  Filled 2021-09-09: qty 50

## 2021-09-09 MED ORDER — FAMOTIDINE 20 MG PO TABS
20.0000 mg | ORAL_TABLET | Freq: Two times a day (BID) | ORAL | 0 refills | Status: DC
Start: 2021-09-09 — End: 2023-08-02
  Filled 2021-09-10: qty 30, 15d supply, fill #0

## 2021-09-09 MED ORDER — SODIUM CHLORIDE 0.9 % IV BOLUS
2000.0000 mL | Freq: Once | INTRAVENOUS | Status: AC
  Administered 2021-09-09: 2000 mL via INTRAVENOUS

## 2021-09-09 MED ORDER — LOPERAMIDE HCL 2 MG PO CAPS
2.0000 mg | ORAL_CAPSULE | Freq: Four times a day (QID) | ORAL | 0 refills | Status: DC | PRN
  Filled 2021-09-10: qty 12, 3d supply, fill #0

## 2021-09-09 MED ORDER — IOHEXOL 300 MG/ML  SOLN
100.0000 mL | Freq: Once | INTRAMUSCULAR | Status: AC | PRN
  Administered 2021-09-09: 100 mL via INTRAVENOUS

## 2021-09-09 MED ORDER — LOPERAMIDE HCL 2 MG PO CAPS: 2.0000 mg | ORAL_CAPSULE | Freq: Four times a day (QID) | ORAL | 0 refills | Status: DC | PRN

## 2021-09-09 MED ORDER — ONDANSETRON HCL 4 MG PO TABS: 4.0000 mg | ORAL_TABLET | ORAL | 0 refills | Status: DC | PRN

## 2021-09-09 MED ORDER — FAMOTIDINE 20 MG PO TABS: 20.0000 mg | ORAL_TABLET | Freq: Two times a day (BID) | ORAL | 0 refills | Status: DC

## 2021-09-09 MED ORDER — ONDANSETRON HCL 4 MG/2ML IJ SOLN
4.0000 mg | Freq: Once | INTRAMUSCULAR | Status: AC | PRN
  Administered 2021-09-09: 4 mg via INTRAVENOUS
  Filled 2021-09-09: qty 2

## 2021-09-09 NOTE — ED Notes (Signed)
Patient transported to CT 

## 2021-09-09 NOTE — ED Provider Notes (Signed)
?[name] ?Provider Note ? ? ?CSN: [id number] ?Arrival date & time: 09/09/21  1755 ? ?  ? ?History ? ?Chief Complaint  ?Patient presents with  ? Emesis  ? ? ?Kevin Moon is a 36 y.o. male. ? ?HPI ?Patient reports he started getting a lot of diarrhea 2 days ago.  Then yesterday evening he started getting vomiting as well.  He had a lot of vomiting overnight and this morning.  He reports he started to feel very weak and fatigued.  He is not able to keep any fluids in.  He reports he is also started to get a general achy feeling in his abdomen.  No documented fever.  Nothing he ate that he can think of that cause symptoms.  No sick contact.  Patient had more recently started Ozempic for weight loss.  He was unsure if this might be contributing. ? ?At baseline, patient reports he drinks about 5-6 alcohol beverages per week.  Last alcohol was 2 nights ago a margarita with dinner. ?  ? ?Home Medications ?Prior to Admission medications   ?Medication Sig Start Date End Date Taking? Authorizing Provider  ?famotidine (PEPCID) 20 MG tablet Take 1 tablet (20 mg total) by mouth 2 (two) times daily. 09/09/21  Yes Charlesetta Shanks, MD  ?ibuprofen (ADVIL) 200 MG tablet Take 200 mg by mouth every 6 (six) hours as needed for fever.   Yes [provider]  ?loperamide (IMODIUM) 2 MG capsule Take 1 capsule (2 mg total) by mouth 4 (four) times daily as needed for diarrhea or loose stools. 09/09/21  Yes Charlesetta Shanks, MD  ?ondansetron (ZOFRAN) 4 MG tablet Take 1 tablet (4 mg total) by mouth every 4 (four) hours as needed for nausea or vomiting. 09/09/21  Yes Charlesetta Shanks, MD  ?Semaglutide, 1 MG/DOSE, (OZEMPIC, 1 MG/DOSE,) 4 MG/3ML SOPN Inject 1mg  Powell weekly 4mg /58ml pen 09/02/21  Yes   ?acetaminophen (TYLENOL 8 HOUR) 650 MG CR tablet Take 1 tablet (650 mg total) by mouth every 8 (eight) hours as needed for pain or fever. 12/16/18   Petrucelli, Samantha R, PA-C  ?benzonatate (TESSALON) 100 MG capsule Take 1  capsule (100 mg total) by mouth every 8 (eight) hours. 12/16/18   Petrucelli, Samantha R, PA-C  ?fluticasone (FLONASE) 50 MCG/ACT nasal spray Place 1 spray into both nostrils daily. 12/16/18   Petrucelli, Samantha R, PA-C  ?pseudoephedrine (SUDAFED) 120 MG 12 hr tablet Take 120 mg by mouth every 12 (twelve) hours as needed for congestion.    [provider]  ?Semaglutide,0.25 or 0.5MG /DOS, (OZEMPIC, 0.25 OR 0.5 MG/DOSE,) 2 MG/1.5ML SOPN Inject 0.5mg  weekly 08/04/21     ?vitamin C (ASCORBIC ACID) 500 MG tablet Take 1,000 mg by mouth daily.    [provider]  ?   ? ?Allergies    ?Patient has no known allergies.   ? ?Review of Systems   ?Review of Systems ?10 systems reviewed negative except as per HPI ?Physical Exam ?Updated Vital Signs ?BP 128/84   Pulse 90   Temp 97.7 ?F (36.5 ?C) (Axillary)   Resp 16   Ht 5\' 10"  (1.778 m)   Wt 108.4 kg   SpO2 97%   BMI 34.29 kg/m?  ?Physical Exam ?Constitutional:   ?   Comments: Alert nontoxic.  Clear mental status.  No respiratory distress  ?HENT:  ?   Mouth/Throat:  ?   Pharynx: Oropharynx is clear.  ?Eyes:  ?   Extraocular Movements: Extraocular movements intact.  ?Cardiovascular:  ?  Rate and Rhythm: Normal rate and regular rhythm.  ?Pulmonary:  ?   Effort: Pulmonary effort is normal.  ?   Breath sounds: Normal breath sounds.  ?Abdominal:  ?   Comments: Abdomen soft mild to moderate discomfort central palpation in lower abdomen to the right.  ?Musculoskeletal:     ?   General: No swelling or tenderness. Normal range of motion.  ?   Right lower leg: No edema.  ?   Left lower leg: No edema.  ?Skin: ?   General: Skin is warm and dry.  ?Neurological:  ?   General: No focal deficit present.  ?   Mental Status: He is oriented to person, place, and time.  ?   Coordination: Coordination normal.  ?Psychiatric:     ?   Mood and Affect: Mood normal.  ? ? ?ED Results / Procedures / Treatments   ?Labs ?(all labs ordered are listed, but only abnormal results are  displayed) ?Labs Reviewed  ?LIPASE, BLOOD - Abnormal; Notable for the following components:  ?    Result Value  ? Lipase 79 (*)   ? All other components within normal limits  ?COMPREHENSIVE METABOLIC PANEL - Abnormal; Notable for the following components:  ? Potassium 3.3 (*)   ? CO2 21 (*)   ? Glucose, Bld 127 (*)   ? Calcium 10.6 (*)   ? Total Protein 9.1 (*)   ? Albumin 5.5 (*)   ? All other components within normal limits  ?CBC - Abnormal; Notable for the following components:  ? WBC 20.7 (*)   ? RBC 6.11 (*)   ? Hemoglobin 17.9 (*)   ? HCT 53.0 (*)   ? All other components within normal limits  ?URINALYSIS, ROUTINE W REFLEX MICROSCOPIC - Abnormal; Notable for the following components:  ? Specific Gravity, Urine 1.035 (*)   ? Ketones, ur TRACE (*)   ? Protein, ur 30 (*)   ? All other components within normal limits  ? ? ?EKG ?None ? ?Radiology ?CT Abdomen Pelvis W Contrast ? ?Result Date: 09/09/2021 ?CLINICAL DATA:  Acute abdominal pain for 2 days EXAM: CT ABDOMEN AND PELVIS WITH CONTRAST TECHNIQUE: Multidetector CT imaging of the abdomen and pelvis was performed using the standard protocol following bolus administration of intravenous contrast. RADIATION DOSE REDUCTION: This exam was performed according to the departmental dose-optimization program which includes automated exposure control, adjustment of the mA and/or kV according to patient size and/or use of iterative reconstruction technique. CONTRAST:  149mL OMNIPAQUE IOHEXOL 300 MG/ML  SOLN COMPARISON:  None. FINDINGS: Lower chest: No acute abnormality. Hepatobiliary: No focal liver abnormality is seen. No gallstones, gallbladder wall thickening, or biliary dilatation. Pancreas: Unremarkable. No pancreatic ductal dilatation or surrounding inflammatory changes. Spleen: Normal in size without focal abnormality. Adrenals/Urinary Tract: Adrenal glands are within normal limits. Kidneys are well visualized bilaterally. No renal calculi or obstructive changes are  noted. Bladder is partially distended Stomach/Bowel: The appendix is within normal limits. Fluid is noted throughout the colon without obstructive change. This may be related to the underlying diarrheal state. Small bowel and stomach are within normal limits. Vascular/Lymphatic: No significant vascular findings are present. No enlarged abdominal or pelvic lymph nodes. Reproductive: Prostate is unremarkable. Other: No abdominal wall hernia or abnormality. No abdominopelvic ascites. Musculoskeletal: No acute or significant osseous findings. IMPRESSION: Fluid throughout the colon consistent with the diarrheal state. No other focal abnormality is noted. Electronically Signed   By: Inez Catalina M.D.   On: 09/09/2021  22:17   ? ?Procedures ?Procedures  ? ? ?Medications Ordered in ED ?Medications  ?loperamide (IMODIUM) capsule 4 mg (has no administration in time range)  ?ondansetron (ZOFRAN-ODT) disintegrating tablet 4 mg (has no administration in time range)  ?ondansetron [facility]) injection 4 mg (4 mg Intravenous Given 09/09/21 1834)  ?sodium chloride 0.9 % bolus 2,000 mL (0 mLs Intravenous Stopped 09/09/21 2321)  ?ondansetron [facility]) injection 4 mg (4 mg Intravenous Given 09/09/21 2111)  ?famotidine (PEPCID) IVPB 20 mg premix (0 mg Intravenous Stopped 09/09/21 2146)  ?iohexol (OMNIPAQUE) 300 MG/ML solution 100 mL (100 mLs Intravenous Contrast Given 09/09/21 2153)  ? ? ?ED Course/ Medical Decision Making/ A&P ?  ?                        ?Medical Decision Making ?Amount and/or Complexity of Data Reviewed ?Labs: ordered. ?Radiology: ordered. ? ?Risk ?Prescription drug management. ? ? ?Patient presents with acute onset of diarrheal illness followed by vomiting and then abdominal pain.  Patient does have mild elevation in lipase.  Patient has leukocytosis.  We will also proceed with CT scan.  Patient being rehydrated with 2 L of normal saline and treated for nausea with Zofran and Pepcid for gastritis. ? ?Patient feels much improved  after hydration and symptomatic control. ? ?CT scan does not show acute findings.  At this time with patient significantly improved and suspicion for gastroenteritis, plan will be for Zofran and Imodium for symptomat

## 2021-09-09 NOTE — Discharge Instructions (Signed)
1.  You have a mild elevation in a lab value called lipase.  This needs to be rechecked by your doctor within the next 2 to 4 days.  If at any point you feel you are getting increasing or worsening pain in your upper abdomen or back, you continue to have vomiting or feeling ill, you must return to the emergency department for recheck. ?2.  At this time discontinue your Ozempic.  Discussed this with your doctor when you see them in follow-up. ?3.  You may use Zofran and Imodium as prescribed for vomiting and diarrhea.  Try to stay hydrated with frequent amounts of small volumes of fluid.  Return to emergency department if you are continue to have vomiting and diarrhea, feel dehydrated, get worsening pain, fever or other concerning symptoms. ?

## 2021-09-09 NOTE — ED Triage Notes (Signed)
Pt via pov from home with n/v/d x 2 days. Pt states he takes ozempic for weight loss and his last dose was Friday (weekly). Pt reports abdominal pain as well. Pt actively vomiting in triage.  ?

## 2021-09-10 ENCOUNTER — Other Ambulatory Visit (HOSPITAL_BASED_OUTPATIENT_CLINIC_OR_DEPARTMENT_OTHER): Payer: Self-pay

## 2021-12-20 ENCOUNTER — Other Ambulatory Visit (HOSPITAL_BASED_OUTPATIENT_CLINIC_OR_DEPARTMENT_OTHER): Payer: Self-pay

## 2022-02-03 ENCOUNTER — Other Ambulatory Visit (HOSPITAL_BASED_OUTPATIENT_CLINIC_OR_DEPARTMENT_OTHER): Payer: Self-pay

## 2022-02-03 MED ORDER — OZEMPIC (0.25 OR 0.5 MG/DOSE) 2 MG/3ML ~~LOC~~ SOPN
PEN_INJECTOR | SUBCUTANEOUS | 0 refills | Status: DC
  Filled 2022-02-03: qty 3, 28d supply, fill #0

## 2022-02-03 MED ORDER — OZEMPIC (0.25 OR 0.5 MG/DOSE) 2 MG/3ML ~~LOC~~ SOPN
PEN_INJECTOR | SUBCUTANEOUS | 0 refills | Status: DC
  Filled 2022-02-28: qty 3, 28d supply, fill #0

## 2022-02-28 ENCOUNTER — Other Ambulatory Visit (HOSPITAL_BASED_OUTPATIENT_CLINIC_OR_DEPARTMENT_OTHER): Payer: Self-pay

## 2022-03-15 ENCOUNTER — Other Ambulatory Visit (HOSPITAL_BASED_OUTPATIENT_CLINIC_OR_DEPARTMENT_OTHER): Payer: Self-pay

## 2022-03-15 MED ORDER — OZEMPIC (1 MG/DOSE) 4 MG/3ML ~~LOC~~ SOPN
1.0000 mg | PEN_INJECTOR | SUBCUTANEOUS | 0 refills | Status: DC
  Filled 2022-03-15 – 2022-03-23 (×2): qty 3, 28d supply, fill #0

## 2022-03-23 ENCOUNTER — Other Ambulatory Visit (HOSPITAL_BASED_OUTPATIENT_CLINIC_OR_DEPARTMENT_OTHER): Payer: Self-pay

## 2022-05-31 ENCOUNTER — Other Ambulatory Visit (HOSPITAL_BASED_OUTPATIENT_CLINIC_OR_DEPARTMENT_OTHER): Payer: Self-pay

## 2022-05-31 MED ORDER — PSEUDOEPH-BROMPHEN-DM 30-2-10 MG/5ML PO SYRP
ORAL_SOLUTION | ORAL | 0 refills | Status: DC
  Filled 2022-05-31: qty 118, 3d supply, fill #0

## 2022-05-31 MED ORDER — AMOXICILLIN-POT CLAVULANATE 875-125 MG PO TABS
ORAL_TABLET | ORAL | 0 refills | Status: DC
  Filled 2022-05-31: qty 20, 10d supply, fill #0

## 2022-11-11 ENCOUNTER — Other Ambulatory Visit (HOSPITAL_BASED_OUTPATIENT_CLINIC_OR_DEPARTMENT_OTHER): Payer: Self-pay

## 2022-11-14 ENCOUNTER — Other Ambulatory Visit (HOSPITAL_BASED_OUTPATIENT_CLINIC_OR_DEPARTMENT_OTHER): Payer: Self-pay

## 2022-11-14 MED ORDER — PREDNISOLONE ACETATE 1 % OP SUSP
1.0000 [drp] | Freq: Four times a day (QID) | OPHTHALMIC | 0 refills | Status: AC
  Filled 2022-11-14: qty 5, 14d supply, fill #0

## 2023-02-14 ENCOUNTER — Ambulatory Visit: Payer: Managed Care, Other (non HMO) | Admitting: Internal Medicine

## 2023-03-16 ENCOUNTER — Encounter: Payer: Self-pay | Admitting: Internal Medicine

## 2023-03-16 ENCOUNTER — Ambulatory Visit: Payer: Managed Care, Other (non HMO) | Admitting: Internal Medicine

## 2023-03-16 ENCOUNTER — Other Ambulatory Visit (HOSPITAL_BASED_OUTPATIENT_CLINIC_OR_DEPARTMENT_OTHER): Payer: Self-pay

## 2023-03-16 VITALS — HR 69 | Temp 97.9°F | Ht 70.0 in | Wt 258.6 lb

## 2023-03-16 DIAGNOSIS — Z0000 Encounter for general adult medical examination without abnormal findings: Secondary | ICD-10-CM | POA: Diagnosis not present

## 2023-03-16 DIAGNOSIS — E65 Localized adiposity: Secondary | ICD-10-CM | POA: Diagnosis not present

## 2023-03-16 DIAGNOSIS — Z0001 Encounter for general adult medical examination with abnormal findings: Secondary | ICD-10-CM

## 2023-03-16 DIAGNOSIS — E782 Mixed hyperlipidemia: Secondary | ICD-10-CM | POA: Diagnosis not present

## 2023-03-16 DIAGNOSIS — Z8249 Family history of ischemic heart disease and other diseases of the circulatory system: Secondary | ICD-10-CM

## 2023-03-16 DIAGNOSIS — E785 Hyperlipidemia, unspecified: Secondary | ICD-10-CM | POA: Insufficient documentation

## 2023-03-16 DIAGNOSIS — J309 Allergic rhinitis, unspecified: Secondary | ICD-10-CM

## 2023-03-16 LAB — LIPID PANEL
Cholesterol: 235 mg/dL — ABNORMAL HIGH (ref 0–200)
HDL: 46.6 mg/dL (ref >39.00)
LDL Cholesterol: 136 mg/dL — ABNORMAL HIGH (ref 0–99)
NonHDL: 188.17
Total CHOL/HDL Ratio: 5
Triglycerides: 263 mg/dL — ABNORMAL HIGH (ref 0.0–149.0)
VLDL: 52.6 mg/dL — ABNORMAL HIGH (ref 0.0–40.0)

## 2023-03-16 LAB — CBC WITH DIFFERENTIAL/PLATELET
Basophils Absolute: 0 10*3/uL (ref 0.0–0.1)
Basophils Relative: 0.3 % (ref 0.0–3.0)
Eosinophils Absolute: 0.2 10*3/uL (ref 0.0–0.7)
Eosinophils Relative: 2.8 % (ref 0.0–5.0)
HCT: 48.3 % (ref 39.0–52.0)
Hemoglobin: 15.9 g/dL (ref 13.0–17.0)
Lymphocytes Relative: 28.1 % (ref 12.0–46.0)
Lymphs Abs: 2.1 10*3/uL (ref 0.7–4.0)
MCHC: 32.8 g/dL (ref 30.0–36.0)
MCV: 91.3 fL (ref 78.0–100.0)
Monocytes Absolute: 0.5 10*3/uL (ref 0.1–1.0)
Monocytes Relative: 7.5 % (ref 3.0–12.0)
Neutro Abs: 4.5 10*3/uL (ref 1.4–7.7)
Neutrophils Relative %: 61.3 % (ref 43.0–77.0)
Platelets: 195 10*3/uL (ref 150.0–400.0)
RBC: 5.29 Mil/uL (ref 4.22–5.81)
RDW: 13.6 % (ref 11.5–15.5)
WBC: 7.3 10*3/uL (ref 4.0–10.5)

## 2023-03-16 LAB — COMPREHENSIVE METABOLIC PANEL
ALT: 9 U/L (ref 0–53)
AST: 20 U/L (ref 0–37)
Albumin: 4.5 g/dL (ref 3.5–5.2)
Alkaline Phosphatase: 71 U/L (ref 39–117)
BUN: 18 mg/dL (ref 6–23)
CO2: 30 meq/L (ref 19–32)
Calcium: 9.8 mg/dL (ref 8.4–10.5)
Chloride: 103 meq/L (ref 96–112)
Creatinine, Ser: 0.96 mg/dL (ref 0.40–1.50)
GFR: 99.85 mL/min (ref >60.00)
Glucose, Bld: 93 mg/dL (ref 70–99)
Potassium: 4.5 meq/L (ref 3.5–5.1)
Sodium: 139 meq/L (ref 135–145)
Total Bilirubin: 0.7 mg/dL (ref 0.2–1.2)
Total Protein: 6.9 g/dL (ref 6.0–8.3)

## 2023-03-16 LAB — HEMOGLOBIN A1C: Hgb A1c MFr Bld: 5.3 % (ref 4.6–6.5)

## 2023-03-16 LAB — TSH: TSH: 0.94 u[IU]/mL (ref 0.35–5.50)

## 2023-03-16 MED ORDER — WEGOVY 0.25 MG/0.5ML ~~LOC~~ SOAJ
0.2500 mg | SUBCUTANEOUS | 11 refills | Status: DC
  Filled 2023-03-16: qty 2, 28d supply, fill #0

## 2023-03-16 NOTE — Patient Instructions (Addendum)
VISIT SUMMARY:  During your annual wellness exam, we discussed your concerns about cholesterol levels and overall health. We noted your recent efforts to improve your diet and exercise routine, which are commendable. We also discussed your history of sinus issues and potential interventions to manage them.  YOUR PLAN:  -HIGH CHOLESTEROL: High cholesterol can increase your risk of heart disease. We will check your cholesterol levels today and may consider starting you on a medication called rosuvastatin, depending on the results. Please continue your efforts to improve your diet and exercise routine.  -SINUS ISSUES: Your chronic sinus infections and nasal inflammation may be due to allergies. We are considering a sinus rinse and possibly referring you to an allergist. Surgery is also an option we are considering.  -GENERAL HEALTH: We will check your blood count, metabolic panel, thyroid function, and A1c today. We also discussed various cancer screenings, but you declined due to cost. We discussed the potential for sleep apnea and recommended the [employer] app for home monitoring. Please continue your healthy diet modifications, focusing on heart-healthy foods.  INSTRUCTIONS:  Please continue your efforts to improve your diet and exercise routine. Monitor your sleep using the [employer] app and report any concerns. We will contact you with the results of your lab tests and discuss next steps based on those results.  Detailed Plan: I've created a comprehensive diet and health management plan tailored to your specific needs. This plan includes a diet strategy for weight loss and cholesterol management, as well as guidelines for managing sinus issues. The diet plan focuses on incorporating fruits, vegetables, extra virgin olive oil, avocado, and fish as discussed, while eliminating heart-unhealthy items. It's designed to be budget-friendly and includes tips for packing lunches for work. For sinus  management, I've included a variety of strategies ranging from daily nasal rinses to lifestyle modifications that can help reduce sinus issues.    # Custom Diet and Health Management Plan  ## Diet Plan for Weight Loss and Cholesterol Management  ### Meal Guidelines: 1. Focus on fruits, vegetables, lean proteins (especially fish), whole grains, and healthy fats. 2. Incorporate extra virgin olive oil and avocados for heart-healthy fats. 3. Eliminate processed foods, sugary drinks, and foods high in saturated fats. 4. Aim for balanced meals with a variety of nutrients.  ### Sample Meal Plan:  #### Breakfast: - Oatmeal with berries and a tablespoon of ground flaxseed - [place] yogurt with sliced almonds and a drizzle of honey - Whole grain toast with mashed avocado and a hard-boiled egg  #### Lunch (Packed for Work): - Grilled chicken salad with mixed greens, cherry tomatoes, cucumber, and olive oil dressing - Tuna sandwich on whole grain bread with lettuce and tomato - Lentil soup with a side of carrot sticks and hummus  #### Dinner: - Baked salmon with roasted vegetables and quinoa - Stir-fried tofu with mixed vegetables and brown rice - Grilled lean beef with sweet potato and steamed broccoli  #### Snacks: - Apple slices with almond butter - Carrot sticks with hummus - Mixed nuts (in moderation) - Fresh fruit  ### Tips for Budget-Friendly Shopping: 1. Buy fruits and vegetables in season 2. Purchase frozen fruits and vegetables when fresh options are expensive 3. Buy fish in bulk when on sale and freeze portions 4. Use canned fish (like sardines or tuna) as a budget-friendly option - try out other options besides salmon to keep it fresh but salmon is great. 5. Buy whole grains and legumes in bulk 6. Look for  sales on lean meats and freeze extra portions  ## Sinus Management Plan  1. Daily Nasal Rinse:    - Use a neti pot or saline nasal spray twice daily to flush out  allergens and irritants.  2. Stay Hydrated:    - Drink plenty of water to keep mucus thin and promote drainage.  3. Humidify:    - Use a humidifier in your bedroom to keep air moist, especially during dry seasons.  4. Avoid Triggers:    - Identify and avoid common allergens like dust, pollen, and pet dander.    - Quit smoking if applicable and avoid secondhand smoke.  5. Exercise:    - Regular moderate exercise can help improve overall health and potentially reduce sinus issues.  6. Diet Modifications:    - Incorporate anti-inflammatory foods like ginger, turmeric, and omega-3 rich fish.    - Reduce consumption of dairy if it seems to worsen symptoms.  7. Over-the-Counter Medications:    - Use antihistamines or decongestants as needed, following package instructions.  8. Steam Inhalation:    - Inhale steam from a bowl of hot water or take a hot shower to help clear sinuses.  9. Elevate Head While Sleeping:    - Use an extra pillow to keep your head elevated, promoting better drainage.  10. Regular Follow-ups:     - Schedule regular check-ups with your healthcare provider to monitor progress and adjust treatment as needed.  Remember to consult with your healthcare provider before making significant changes to your diet or starting new treatments for sinus issues. This plan should be used in conjunction with professional medical advice.

## 2023-03-16 NOTE — Assessment & Plan Note (Signed)
Check Hemoglobin A1c and tsh

## 2023-03-16 NOTE — Progress Notes (Signed)
[facility]: [phone]   -- Medical Office Visit --    -- Annual Preventive Medical Office Visit -- Patient:  Kevin Moon      Age: 37 y.o.       Sex:  male  Date:   03/16/2023 Patient Care Team: [name], MD as PCP - General (Internal Medicine) Today's Healthcare Provider: [name], MD    Patient:  Kevin Moon      Age: 37 y.o.       Sex:  male  Date:   03/16/2023 Patient Care Team: [name], MD as PCP - General (Internal Medicine) Today's Healthcare Provider: [name], MD   Chief Complaint  Patient presents with   New Patient (Initial Visit)        Check cholesterol level   Annual Exam  Purpose of Visit: Comprehensive preventive health assessment and personalized health maintenance planning.  This encounter was conducted as a Comprehensive Physical Exam (CPE) preventive care annual visit. The patient's medical history and problem list were reviewed to inform individualized preventive care recommendations. No problem-specific medical treatment was provided during this visit. Assessment & Plan Encounter for annual health examination  Preventative health care  Central adiposity Check Hemoglobin A1c and tsh Mixed hyperlipidemia They exhibit high cholesterol with a family history of heart disease, compounded by a sedentary lifestyle and poor diet, though recent improvements are noted. We discussed the benefits of Wegovy for weight loss and cholesterol improvement; however, they had a prior adverse reaction to a similar medication, Ozempic. We will check their lipid panel today and consider starting rosuvastatin depending on the cholesterol levels. They are advised to continue lifestyle modifications, including diet and exercise. Family history of arteriosclerotic cardiovascular disease  Encounter for preventive care We will check their blood count, metabolic panel, thyroid function, and A1c today. We discussed various cancer  screenings, including colon, prostate, and full body MRI, but they declined due to cost. The potential for sleep apnea was discussed, and the Snorlab app was recommended for home monitoring. They are advised to continue healthy diet modifications, focusing on heart-healthy foods.  An individualized diet and allergy management plan was created for AVS. Allergic rhinitis, unspecified seasonality, unspecified trigger They suffer from chronic sinus infections and nasal inflammation. We will consider a sinus rinse and an allergist referral. Surgery for chronic sinus issues is also under consideration. Encounter for annual general medical examination with abnormal findings in adult   Diagnoses and all orders for this visit: Encounter for annual health examination Preventative health care Central adiposity -     CBC w/Diff -     Comp Met (CMET) -     Lipid panel -     TSH -     HgB A1c -     Semaglutide-Weight Management (WEGOVY) 0.25 MG/0.5ML SOAJ; Inject 0.25 mg into the skin once a week for 48 doses. Mixed hyperlipidemia -     CBC w/Diff -     Comp Met (CMET) -     Lipid panel -     TSH -     HgB A1c -     Semaglutide-Weight Management (WEGOVY) 0.25 MG/0.5ML SOAJ; Inject 0.25 mg into the skin once a week for 48 doses. Family history of arteriosclerotic cardiovascular disease -     CBC w/Diff -     Comp Met (CMET) -     Lipid panel -     TSH -     HgB A1c -  Semaglutide-Weight Management (WEGOVY) 0.25 MG/0.5ML SOAJ; Inject 0.25 mg into the skin once a week for 48 doses. Encounter for preventive care -     CBC w/Diff -     Comp Met (CMET) -     Lipid panel -     TSH -     HgB A1c -     Semaglutide-Weight Management (WEGOVY) 0.25 MG/0.5ML SOAJ; Inject 0.25 mg into the skin once a week for 48 doses. Allergic rhinitis, unspecified seasonality, unspecified trigger Encounter for annual general medical examination with abnormal findings in adult  Recommended follow-up: annual  preventive  Health Maintenance counseling: 1. Anticipatory guidance: Patient counseled regarding regular dental exams, eye exams, safety 2. Risk factor reduction:  Advised patient of need for regular exercise and diet rich and fruits and vegetables to reduce risk of heart attack and stroke.  3. Immunizations/screenings/ancillary studies Immunization History  Administered Date(s) Administered   Fluzone Influenza virus vaccine,trivalent (IIV3), split virus 05/19/2014   PFIZER(Purple Top)SARS-COV-2 Vaccination 07/15/2019, 08/12/2019   Health Maintenance Due  Topic Date Due   DTaP/Tdap/Td (1 - Tdap) Never done   4. Prostate cancer screening-  No results found for: "PSA" declined / deferred / expressed a preference to not move forward with  5. Colon cancer screening -  no bleeding or family history  6. Skin cancer screening-  denied lesions of concern no lesions of concern(s) with extremity exposed exam   Sinus health:  We encouraged sterile saline nasal misting sinus rinses daily for pollen, to reduce allergies and risk for sinus infections.   Sterile can based misting products are recommended due to superior misting and ease of maintaining sterility Sleep Apnea screening:  We encourage self monitoring of sleep quality with SnoreLab App and other tools/apps that are now available at retail Cardiovascular Risk Factor Reduction:   Advised patient of need for regular exercise and diet rich and fruits and vegetables and healthy fats to reduce risk of heart attack and stroke.  Avoid first- and second-hand smoke and stimulants.   Avoid extreme exercise- exercise in moderation (150 minutes per week is a good goal) Wt Readings from Last 3 Encounters:  03/16/23 258 lb 9.6 oz (117.3 kg)  09/09/21 239 lb (108.4 kg)  12/16/18 245 lb (111.1 kg)  Body mass index is 37.11 kg/m. Health maintenance and immunizations reviewed and he was encouraged to complete anything that is due: Immunization History   Administered Date(s) Administered   Fluzone Influenza virus vaccine,trivalent (IIV3), split virus 05/19/2014   PFIZER(Purple Top)SARS-COV-2 Vaccination 07/15/2019, 08/12/2019   Health Maintenance Due  Topic Date Due   DTaP/Tdap/Td (1 - Tdap) Never done    Sexual transmitted infection screening: testing offered today, but patient declined as he feels he is low risk based on his sexual history.   Substance use:   recommendation is total abstinence from all substances of abuse including smoke and 2nd hand smoke, alcohol, illicit drugs, smoking, inhalants, sugar.   Offered to assist with any use disorders or addictions.    Cancer Screening: Penile/Testicle/Scrotum cancer screening: Asked the patient about genital warts or tumors/abnormalities of penis/testicles/scrotum, encouraged patient to to inform me of any. Patient reports there are none. Thyroid cancer screening: patient advised to check by palpating thyroid for nodules Prostate cancer screening:  Denies family history of prostate cancer or hematospermia so too young for screening by current guidelines.(No results found for: "PSA") Colon cancer screening:   Denies strong family history of colon cancer or blood in stool so  no screening is indicated until age 32.   Skin cancer screening: He denies worrisome, changing, or new skin lesions.   Return to care in 1 year for next preventative visit.   Subjective  AI-Extracted: Discussed the use of AI scribe software for clinical note transcription with the patient, who gave verbal consent to proceed.  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 They report a history of a sedentary lifestyle and poor diet, which they have recently attempted to modify. 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 They also report attempting to exercise three to four times a week, although consistency varies.  The patient has a family history of heart disease, with both parents having high cholesterol and high blood pressure, and a grandfather who died of a heart attack. They acknowledge their risk for heart disease and express willingness to make lifestyle changes to mitigate this risk. They have previously tried Ozempic for weight loss and cholesterol management but experienced adverse effects at higher doses. They are open to trying similar medications at lower doses if affordable.  The patient also reports a history of sinus issues, experiencing sinus infections approximately twice a year. They have tried allergy medications with limited success and express interest in potential surgical interventions. They also report snoring, but do not believe they have sleep apnea. They have not noticed any significant breathing difficulties or morning headaches.  The patient has a broken tooth, which they report is not currently causing pain. They also have a pierced ear. They have not noticed any changes or issues with these areas. They report no other significant health issues at this time.      ROS A comprehensive ROS was negative for any concerning symptoms.   Disclaimer about ROS at Annual Preventive Visits Patients are informed before the Review of Systems (ROS) that identifying significant medical issues during the wellness visit may require immediate attention, potentially resulting in a separate billable encounter beyond the scope of the preventive exam. This disclosure is mandated by professional ethics and legal obligations, as healthcare providers must address any substantial health concerns raised during any patient interaction. A comprehensive ROS is required by insurance companies for billing the visit. However, this structure may inadvertently discourage patients from fully disclosing  health concerns due to potential financial implications. Consequently, patients often emphasize that any positive ROS findings are related to stable chronic conditions, requesting that these not be discussed during the preventive visit to avoid additional charges. Patients may also ask that reported complaints not be listed in the ROS to prevent affecting billing.  Problem list overviews that were updated at today's visit: Problem  Central Adiposity  Hyperlipidemia   I attest that I have reviewed and confirmed the patients current medications to meet the medication reconciliation requirement  Current Outpatient Medications on File Prior to Visit  Medication Sig   ibuprofen (ADVIL) 200 MG tablet Take 200 mg by mouth every 6 (six) hours as needed for moderate pain.   naproxen (NAPROSYN) 500 MG tablet naproxen 500 mg tablet  TAKE 1 TABLET BY MOUTH TWICE DAILY   acetaminophen (TYLENOL 8 HOUR) 650 MG CR tablet Take 1 tablet (650 mg total) by mouth every 8 (eight) hours as needed for pain or fever. (Patient not taking: Reported on 03/16/2023)   benzonatate (  TESSALON) 100 MG capsule Take 1 capsule (100 mg total) by mouth every 8 (eight) hours. (Patient not taking: Reported on 03/16/2023)   famotidine (PEPCID) 20 MG tablet Take 1 tablet (20 mg total) by mouth 2 (two) times daily. (Patient not taking: Reported on 03/16/2023)   loperamide (IMODIUM) 2 MG capsule Take 1 capsule (2 mg total) by mouth 4 (four) times daily as needed for diarrhea or loose stools. (Patient not taking: Reported on 03/16/2023)   ondansetron (ZOFRAN) 4 MG tablet Take 1 tablet (4 mg total) by mouth every 4 (four) hours as needed for nausea or vomiting. (Patient not taking: Reported on 03/16/2023)   pseudoephedrine (SUDAFED) 120 MG 12 hr tablet Take 120 mg by mouth every 12 (twelve) hours as needed for congestion. (Patient not taking: Reported on 03/16/2023)   Semaglutide, 1 MG/DOSE, (OZEMPIC, 1 MG/DOSE,) 4 MG/3ML SOPN Inject 1mg  [place]  weekly 4mg /5ml pen (Patient not taking: Reported on 03/16/2023)   Semaglutide, 1 MG/DOSE, (OZEMPIC, 1 MG/DOSE,) 4 MG/3ML SOPN Inject 1 mg into the skin once a week. (Patient not taking: Reported on 03/16/2023)   Semaglutide,0.25 or 0.5MG /DOS, (OZEMPIC, 0.25 OR 0.5 MG/DOSE,) 2 MG/1.5ML SOPN Inject 0.5mg  weekly (Patient not taking: Reported on 03/16/2023)   Semaglutide,0.25 or 0.5MG /DOS, (OZEMPIC, 0.25 OR 0.5 MG/DOSE,) 2 MG/3ML SOPN Inject 0.25mg  [place] weekly x 2 wks then 0.5mg  [place] weekly (Patient not taking: Reported on 03/16/2023)   Semaglutide,0.25 or 0.5MG /DOS, (OZEMPIC, 0.25 OR 0.5 MG/DOSE,) 2 MG/3ML SOPN Inject 0.5mg  [place] weekly (Patient not taking: Reported on 03/16/2023)   vitamin C (ASCORBIC ACID) 500 MG tablet Take 1,000 mg by mouth daily. (Patient not taking: Reported on 03/16/2023)   No current facility-administered medications on file prior to visit.   Medications Discontinued During This Encounter  Medication Reason   amoxicillin-clavulanate (AUGMENTIN) 875-125 MG tablet    brompheniramine-pseudoephedrine-DM 30-2-10 MG/5ML syrup    ibuprofen (ADVIL) 200 MG tablet    fluticasone (FLONASE) 50 MCG/ACT nasal spray Patient Preference   The following were reviewed and entered/updated into our electronic MEDICAL RECORD NUMBER   03/16/2023    9:27 AM  Depression screen PHQ 2/9  Decreased Interest 0  Down, Depressed, Hopeless 0  PHQ - 2 Score 0   Past Medical History:  Diagnosis Date   Allergies    Patient Active Problem List   Diagnosis Date Noted   Central adiposity 03/16/2023   Hyperlipidemia 03/16/2023   Family history of arteriosclerotic cardiovascular disease 03/16/2023   Past Surgical History:  Procedure Laterality Date   ROTATOR CUFF REPAIR Right    Family History  Problem Relation Age of Onset   Hyperlipidemia Mother    Hypertension Father    Hyperlipidemia Father    Hearing loss Father    Arthritis Father    Birth defects Daughter    Stroke Paternal Grandmother     Bladder Cancer Paternal Grandfather    Allergies  Allergen Reactions   Fluticasone     Other Reaction(s): did not like scent   Social History   Tobacco Use   Smoking status: Never   Smokeless tobacco: Never  Vaping Use   Vaping status: Never Used  Substance Use Topics   Alcohol use: Yes    Alcohol/week: 6.0 standard drinks of alcohol    Types: 6 Shots of liquor per week   Drug use: Never     Objective  Pulse 69   Temp 97.9 F (36.6 C) (Temporal)   Ht 5\' 10"  (1.778 m)   Wt 258 lb  9.6 oz (117.3 kg)   SpO2 97%   BMI 37.11 kg/m   Body mass index is 37.11 kg/m. Wt Readings from Last 3 Encounters:  03/16/23 258 lb 9.6 oz (117.3 kg)  09/09/21 239 lb (108.4 kg)  12/16/18 245 lb (111.1 kg)   GEN: NAD, Resting Comfortably. HEENT: Tympanic membranes normal appearing bilaterally, Oropharynx clear, No Thyromegaly noted. No palpable lymphadenopathy or thyroid nodules. CARDIOVASCULAR: S1 and S2 heart sounds have regular rate and rhythm with no murmurs appreciated. PULMONARY:  Normal work of breathing. Clear to auscultation bilaterally with no crackles, wheezes, or rhonchi. ABDOMEN: Soft, Nontender, Nondistended.  MSK: No edema, cyanosis, or clubbing noted. SKIN: Warm, dry, no lesions of concern observed. NEURO: CN2-12 grossly intact. Strength 5/5 in upper and lower extremities. Reflexes symmetric and intact bilaterally.  PSYCH: Normal affect and thought content, pleasant and cooperative. He has a past medical history of Allergies.  Problem list overviews that were updated at today's visit: Problem  Central Adiposity  Hyperlipidemia   Current Outpatient Medications on File Prior to Visit  Medication Sig   ibuprofen (ADVIL) 200 MG tablet Take 200 mg by mouth every 6 (six) hours as needed for moderate pain.   naproxen (NAPROSYN) 500 MG tablet naproxen 500 mg tablet  TAKE 1 TABLET BY MOUTH TWICE DAILY   acetaminophen (TYLENOL 8 HOUR) 650 MG CR tablet Take 1 tablet (650 mg  total) by mouth every 8 (eight) hours as needed for pain or fever. (Patient not taking: Reported on 03/16/2023)   benzonatate (TESSALON) 100 MG capsule Take 1 capsule (100 mg total) by mouth every 8 (eight) hours. (Patient not taking: Reported on 03/16/2023)   famotidine (PEPCID) 20 MG tablet Take 1 tablet (20 mg total) by mouth 2 (two) times daily. (Patient not taking: Reported on 03/16/2023)   loperamide (IMODIUM) 2 MG capsule Take 1 capsule (2 mg total) by mouth 4 (four) times daily as needed for diarrhea or loose stools. (Patient not taking: Reported on 03/16/2023)   ondansetron (ZOFRAN) 4 MG tablet Take 1 tablet (4 mg total) by mouth every 4 (four) hours as needed for nausea or vomiting. (Patient not taking: Reported on 03/16/2023)   pseudoephedrine (SUDAFED) 120 MG 12 hr tablet Take 120 mg by mouth every 12 (twelve) hours as needed for congestion. (Patient not taking: Reported on 03/16/2023)   Semaglutide, 1 MG/DOSE, (OZEMPIC, 1 MG/DOSE,) 4 MG/3ML SOPN Inject 1mg  [place] weekly 4mg /36ml pen (Patient not taking: Reported on 03/16/2023)   Semaglutide, 1 MG/DOSE, (OZEMPIC, 1 MG/DOSE,) 4 MG/3ML SOPN Inject 1 mg into the skin once a week. (Patient not taking: Reported on 03/16/2023)   Semaglutide,0.25 or 0.5MG /DOS, (OZEMPIC, 0.25 OR 0.5 MG/DOSE,) 2 MG/1.5ML SOPN Inject 0.5mg  weekly (Patient not taking: Reported on 03/16/2023)   Semaglutide,0.25 or 0.5MG /DOS, (OZEMPIC, 0.25 OR 0.5 MG/DOSE,) 2 MG/3ML SOPN Inject 0.25mg  [place] weekly x 2 wks then 0.5mg  [place] weekly (Patient not taking: Reported on 03/16/2023)   Semaglutide,0.25 or 0.5MG /DOS, (OZEMPIC, 0.25 OR 0.5 MG/DOSE,) 2 MG/3ML SOPN Inject 0.5mg  [place] weekly (Patient not taking: Reported on 03/16/2023)   vitamin C (ASCORBIC ACID) 500 MG tablet Take 1,000 mg by mouth daily. (Patient not taking: Reported on 03/16/2023)   No current facility-administered medications on file prior to visit.   Medications Discontinued During This Encounter  Medication Reason    amoxicillin-clavulanate (AUGMENTIN) 875-125 MG tablet    brompheniramine-pseudoephedrine-DM 30-2-10 MG/5ML syrup    ibuprofen (ADVIL) 200 MG tablet    fluticasone (FLONASE) 50 MCG/ACT nasal spray Patient Preference  Objective   Physical Exam  Pulse 69   Temp 97.9 F (36.6 C) (Temporal)   Ht 5\' 10"  (1.778 m)   Wt 258 lb 9.6 oz (117.3 kg)   SpO2 97%   BMI 37.11 kg/m  Wt Readings from Last 10 Encounters:  03/16/23 258 lb 9.6 oz (117.3 kg)  09/09/21 239 lb (108.4 kg)  12/16/18 245 lb (111.1 kg)  Physical Exam   Vital signs reviewed.  Nursing notes reviewed. Weight trend reviewed. Abnormalities and Problem-Specific physical exam findings:  wearing bullet proof vest, mild truncal adiposity healthy appearing  General Appearance:  No acute distress appreciable.   Well-groomed, healthy-appearing male.  Well proportioned with no abnormal fat distribution.  Good muscle tone. HEENT: Throat without erythema or exudate, teeth intact with exception of left upper first molar which is broken, no acute distress. Nasal mucosa of left nostril significantly inflamed. No lymphadenopathy palpated. Pulmonary:  lungs clear to auscultation bilaterally. Normal work of breathing at rest, no respiratory distress apparent. SpO2: 97 %  Heart: s1 and s2 heart sounds have regular rate and rhythm no murmurs, rubs, or gallops  Musculoskeletal: All extremities are intact.  Neurological:  Awake, alert, oriented, and engaged.  No obvious focal neurological deficits or cognitive impairments.  Sensorium seems unclouded.   Speech is clear and coherent with logical content. Psychiatric:  Appropriate mood, pleasant and cooperative demeanor, thoughtful and engaged during the exam       Results             Results for orders placed or performed in visit on 03/16/23  CBC w/Diff  Result Value Ref Range   WBC 7.3 4.0 - 10.5 K/uL   RBC 5.29 4.22 - 5.81 Mil/uL   Hemoglobin 15.9 13.0 - 17.0 g/dL   HCT 16.1 09.6 - 04.5  %   MCV 91.3 78.0 - 100.0 fl   MCHC 32.8 30.0 - 36.0 g/dL   RDW 40.9 81.1 - 91.4 %   Platelets 195.0 150.0 - 400.0 K/uL   Neutrophils Relative % 61.3 43.0 - 77.0 %   Lymphocytes Relative 28.1 12.0 - 46.0 %   Monocytes Relative 7.5 3.0 - 12.0 %   Eosinophils Relative 2.8 0.0 - 5.0 %   Basophils Relative 0.3 0.0 - 3.0 %   Neutro Abs 4.5 1.4 - 7.7 K/uL   Lymphs Abs 2.1 0.7 - 4.0 K/uL   Monocytes Absolute 0.5 0.1 - 1.0 K/uL   Eosinophils Absolute 0.2 0.0 - 0.7 K/uL   Basophils Absolute 0.0 0.0 - 0.1 K/uL  Comp Met (CMET)  Result Value Ref Range   Sodium 139 135 - 145 mEq/L   Potassium 4.5 3.5 - 5.1 mEq/L   Chloride 103 96 - 112 mEq/L   CO2 30 19 - 32 mEq/L   Glucose, Bld 93 70 - 99 mg/dL   BUN 18 6 - 23 mg/dL   Creatinine, Ser 7.82 0.40 - 1.50 mg/dL   Total Bilirubin 0.7 0.2 - 1.2 mg/dL   Alkaline Phosphatase 71 39 - 117 U/L   AST 20 0 - 37 U/L   ALT 9 0 - 53 U/L   Total Protein 6.9 6.0 - 8.3 g/dL   Albumin 4.5 3.5 - 5.2 g/dL   GFR 95.62 >13.08 mL/min   Calcium 9.8 8.4 - 10.5 mg/dL  Lipid panel  Result Value Ref Range   Cholesterol 235 (H) 0 - 200 mg/dL   Triglycerides 657.8 (H) 0.0 - 149.0 mg/dL   HDL 46.96 >29.52 mg/dL  VLDL 52.6 (H) 0.0 - 40.0 mg/dL   LDL Cholesterol 604 (H) 0 - 99 mg/dL   Total CHOL/HDL Ratio 5    NonHDL 188.17   TSH  Result Value Ref Range   TSH 0.94 0.35 - 5.50 uIU/mL  HgB A1c  Result Value Ref Range   Hgb A1c MFr Bld 5.3 4.6 - 6.5 %    Office Visit on 03/16/2023  Component Date Value   WBC 03/16/2023 7.3    RBC 03/16/2023 5.29    Hemoglobin 03/16/2023 15.9    HCT 03/16/2023 48.3    MCV 03/16/2023 91.3    MCHC 03/16/2023 32.8    RDW 03/16/2023 13.6    Platelets 03/16/2023 195.0    Neutrophils Relative % 03/16/2023 61.3    Lymphocytes Relative 03/16/2023 28.1    Monocytes Relative 03/16/2023 7.5    Eosinophils Relative 03/16/2023 2.8    Basophils Relative 03/16/2023 0.3    Neutro Abs 03/16/2023 4.5    Lymphs Abs 03/16/2023 2.1     Monocytes Absolute 03/16/2023 0.5    Eosinophils Absolute 03/16/2023 0.2    Basophils Absolute 03/16/2023 0.0    Sodium 03/16/2023 139    Potassium 03/16/2023 4.5    Chloride 03/16/2023 103    CO2 03/16/2023 30    Glucose, Bld 03/16/2023 93    BUN 03/16/2023 18    Creatinine, Ser 03/16/2023 0.96    Total Bilirubin 03/16/2023 0.7    Alkaline Phosphatase 03/16/2023 71    AST 03/16/2023 20    ALT 03/16/2023 9    Total Protein 03/16/2023 6.9    Albumin 03/16/2023 4.5    GFR 03/16/2023 99.85    Calcium 03/16/2023 9.8    Cholesterol 03/16/2023 235 (H)    Triglycerides 03/16/2023 263.0 (H)    HDL 03/16/2023 46.60    VLDL 03/16/2023 52.6 (H)    LDL Cholesterol 03/16/2023 136 (H)    Total CHOL/HDL Ratio 03/16/2023 5    NonHDL 03/16/2023 188.17    TSH 03/16/2023 0.94    Hgb A1c MFr Bld 03/16/2023 5.3    No image results found.   No results found.  CT Abdomen Pelvis W Contrast  Result Date: 09/09/2021 CLINICAL DATA:  Acute abdominal pain for 2 days EXAM: CT ABDOMEN AND PELVIS WITH CONTRAST TECHNIQUE: Multidetector CT imaging of the abdomen and pelvis was performed using the standard protocol following bolus administration of intravenous contrast. RADIATION DOSE REDUCTION: This exam was performed according to the departmental dose-optimization program which includes automated exposure control, adjustment of the mA and/or kV according to patient size and/or use of iterative reconstruction technique. CONTRAST:  OMNIPAQUE IOHEXOL 300 MG/ML  SOLN COMPARISON:  None. FINDINGS: Lower chest: No acute abnormality. Hepatobiliary: No focal liver abnormality is seen. No gallstones, gallbladder wall thickening, or biliary dilatation. Pancreas: Unremarkable. No pancreatic ductal dilatation or surrounding inflammatory changes. Spleen: Normal in size without focal abnormality. Adrenals/Urinary Tract: Adrenal glands are within normal limits. Kidneys are well visualized bilaterally. No renal calculi or  obstructive changes are noted. Bladder is partially distended Stomach/Bowel: The appendix is within normal limits. Fluid is noted throughout the colon without obstructive change. This may be related to the underlying diarrheal state. Small bowel and stomach are within normal limits. Vascular/Lymphatic: No significant vascular findings are present. No enlarged abdominal or pelvic lymph nodes. Reproductive: Prostate is unremarkable. Other: No abdominal wall hernia or abnormality. No abdominopelvic ascites. Musculoskeletal: No acute or significant osseous findings. IMPRESSION: Fluid throughout the colon consistent with the diarrheal state. No  other focal abnormality is noted. Electronically Signed   By: Alcide Clever M.D.   On: 09/09/2021 22:17     Additional Info: This encounter employed real-time, collaborative documentation. The patient actively reviewed and updated their medical record on a shared screen, ensuring transparency and facilitating joint problem-solving for the problem list, overview, and plan. This approach promotes accurate, informed care. The treatment plan was discussed and reviewed in detail, including medication safety, potential side effects, and all patient questions. We confirmed understanding and comfort with the plan. Follow-up instructions were established, including contacting the office for any concerns, returning if symptoms worsen, persist, or new symptoms develop, and precautions for potential emergency department visits.

## 2023-03-16 NOTE — Assessment & Plan Note (Signed)
They exhibit high cholesterol with a family history of heart disease, compounded by a sedentary lifestyle and poor diet, though recent improvements are noted. We discussed the benefits of Wegovy for weight loss and cholesterol improvement; however, they had a prior adverse reaction to a similar medication, Ozempic. We will check their lipid panel today and consider starting rosuvastatin depending on the cholesterol levels. They are advised to continue lifestyle modifications, including diet and exercise.

## 2023-03-19 ENCOUNTER — Encounter: Payer: Self-pay | Admitting: Internal Medicine

## 2023-03-19 DIAGNOSIS — J309 Allergic rhinitis, unspecified: Secondary | ICD-10-CM | POA: Insufficient documentation

## 2023-03-19 NOTE — Progress Notes (Signed)
Reviewed lab results from 03/16/2023. Lipid panel shows elevated cholesterol levels. Other results, including blood sugar, liver, and kidney function, are normal. Follow-up appointment recommended in 3 months to reassess lipids and discuss medication. Please see detailed explanation in Patient Message.

## 2023-03-28 ENCOUNTER — Other Ambulatory Visit (HOSPITAL_BASED_OUTPATIENT_CLINIC_OR_DEPARTMENT_OTHER): Payer: Self-pay

## 2023-04-04 NOTE — Telephone Encounter (Signed)
Left vm for patient and mailed lab results letter to patient. Sent my chart message reminding patient to schedule a follow-up visit in three months.

## 2023-04-05 ENCOUNTER — Other Ambulatory Visit: Payer: Self-pay

## 2023-04-05 DIAGNOSIS — E782 Mixed hyperlipidemia: Secondary | ICD-10-CM

## 2023-05-17 ENCOUNTER — Other Ambulatory Visit (HOSPITAL_BASED_OUTPATIENT_CLINIC_OR_DEPARTMENT_OTHER): Payer: Self-pay

## 2023-05-18 ENCOUNTER — Emergency Department (HOSPITAL_BASED_OUTPATIENT_CLINIC_OR_DEPARTMENT_OTHER)
Admission: EM | Admit: 2023-05-18 | Discharge: 2023-05-18 | Disposition: A | Discharge status: Home / Self Care | Payer: Managed Care, Other (non HMO) | Attending: Emergency Medicine | Admitting: Emergency Medicine

## 2023-05-18 ENCOUNTER — Encounter (HOSPITAL_BASED_OUTPATIENT_CLINIC_OR_DEPARTMENT_OTHER): Payer: Self-pay | Admitting: Emergency Medicine

## 2023-05-18 ENCOUNTER — Emergency Department (HOSPITAL_BASED_OUTPATIENT_CLINIC_OR_DEPARTMENT_OTHER): Payer: Managed Care, Other (non HMO) | Admitting: Radiology

## 2023-05-18 ENCOUNTER — Other Ambulatory Visit (HOSPITAL_BASED_OUTPATIENT_CLINIC_OR_DEPARTMENT_OTHER): Payer: Self-pay

## 2023-05-18 ENCOUNTER — Other Ambulatory Visit: Payer: Self-pay

## 2023-05-18 DIAGNOSIS — J189 Pneumonia, unspecified organism: Secondary | ICD-10-CM | POA: Diagnosis not present

## 2023-05-18 DIAGNOSIS — R059 Cough, unspecified: Secondary | ICD-10-CM | POA: Diagnosis present

## 2023-05-18 LAB — CBC WITH DIFFERENTIAL/PLATELET
Abs Immature Granulocytes: 0.04 10*3/uL (ref 0.00–0.07)
Basophils Absolute: 0.1 10*3/uL (ref 0.0–0.1)
Basophils Relative: 1 %
Eosinophils Absolute: 0.2 10*3/uL (ref 0.0–0.5)
Eosinophils Relative: 2 %
HCT: 43.4 % (ref 39.0–52.0)
Hemoglobin: 14.8 g/dL (ref 13.0–17.0)
Immature Granulocytes: 0 %
Lymphocytes Relative: 10 %
Lymphs Abs: 1.1 10*3/uL (ref 0.7–4.0)
MCH: 30.7 pg (ref 26.0–34.0)
MCHC: 34.1 g/dL (ref 30.0–36.0)
MCV: 90 fL (ref 80.0–100.0)
Monocytes Absolute: 1 10*3/uL (ref 0.1–1.0)
Monocytes Relative: 9 %
Neutro Abs: 9.2 10*3/uL — ABNORMAL HIGH (ref 1.7–7.7)
Neutrophils Relative %: 78 %
Platelets: 174 10*3/uL (ref 150–400)
RBC: 4.82 MIL/uL (ref 4.22–5.81)
RDW: 13.1 % (ref 11.5–15.5)
WBC: 11.6 10*3/uL — ABNORMAL HIGH (ref 4.0–10.5)
nRBC: 0 % (ref 0.0–0.2)

## 2023-05-18 LAB — COMPREHENSIVE METABOLIC PANEL
ALT: 9 U/L (ref 0–44)
AST: 20 U/L (ref 15–41)
Albumin: 4.1 g/dL (ref 3.5–5.0)
Alkaline Phosphatase: 69 U/L (ref 38–126)
Anion gap: 10 (ref 5–15)
BUN: 16 mg/dL (ref 6–20)
CO2: 24 mmol/L (ref 22–32)
Calcium: 9.3 mg/dL (ref 8.9–10.3)
Chloride: 106 mmol/L (ref 98–111)
Creatinine, Ser: 1.06 mg/dL (ref 0.61–1.24)
GFR, Estimated: >60 mL/min (ref >60)
Glucose, Bld: 107 mg/dL — ABNORMAL HIGH (ref 70–99)
Potassium: 3.7 mmol/L (ref 3.5–5.1)
Sodium: 140 mmol/L (ref 135–145)
Total Bilirubin: 0.6 mg/dL (ref <1.2)
Total Protein: 7 g/dL (ref 6.5–8.1)

## 2023-05-18 LAB — URINALYSIS, W/ REFLEX TO CULTURE (INFECTION SUSPECTED)
Bacteria, UA: NONE SEEN
Bilirubin Urine: NEGATIVE
Glucose, UA: NEGATIVE mg/dL
Hgb urine dipstick: NEGATIVE
Leukocytes,Ua: NEGATIVE
Nitrite: NEGATIVE
Protein, ur: 30 mg/dL — AB
Specific Gravity, Urine: 1.044 — ABNORMAL HIGH (ref 1.005–1.030)
pH: 6 (ref 5.0–8.0)

## 2023-05-18 LAB — LACTIC ACID, PLASMA: Lactic Acid, Venous: 1.7 mmol/L (ref 0.5–1.9)

## 2023-05-18 MED ORDER — SODIUM CHLORIDE 0.9 % IV SOLN
500.0000 mg | INTRAVENOUS | Status: DC
  Administered 2023-05-18: 500 mg via INTRAVENOUS
  Filled 2023-05-18: qty 5

## 2023-05-18 MED ORDER — IPRATROPIUM BROMIDE 0.06 % NA SOLN
2.0000 | Freq: Three times a day (TID) | NASAL | 0 refills | Status: AC | PRN
  Filled 2023-05-18: qty 15, 25d supply, fill #0

## 2023-05-18 MED ORDER — BENZONATATE 200 MG PO CAPS
200.0000 mg | ORAL_CAPSULE | Freq: Three times a day (TID) | ORAL | 0 refills | Status: DC | PRN
  Filled 2023-05-18: qty 21, 7d supply, fill #0

## 2023-05-18 MED ORDER — PROMETHAZINE-DM 6.25-15 MG/5ML PO SYRP
5.0000 mL | ORAL_SOLUTION | Freq: Four times a day (QID) | ORAL | 0 refills | Status: DC | PRN
  Filled 2023-05-18: qty 180, 7d supply, fill #0

## 2023-05-18 MED ORDER — SODIUM CHLORIDE 0.9% FLUSH: 10.0000 mL | Freq: Two times a day (BID) | INTRAVENOUS | Status: DC

## 2023-05-18 MED ORDER — ALBUTEROL SULFATE HFA 108 (90 BASE) MCG/ACT IN AERS
2.0000 | INHALATION_SPRAY | RESPIRATORY_TRACT | 0 refills | Status: DC | PRN
  Filled 2023-05-18: qty 6.7, 17d supply, fill #0

## 2023-05-18 MED ORDER — DOXYCYCLINE HYCLATE 100 MG PO TABS
100.0000 mg | ORAL_TABLET | Freq: Two times a day (BID) | ORAL | 0 refills | Status: DC
  Filled 2023-05-18: qty 20, 10d supply, fill #0

## 2023-05-18 MED ORDER — SODIUM CHLORIDE 0.9 % IV BOLUS
1000.0000 mL | Freq: Once | INTRAVENOUS | Status: AC
Start: 2023-05-18 — End: 2023-05-18
  Administered 2023-05-18: 1000 mL via INTRAVENOUS

## 2023-05-18 MED ORDER — AMOXICILLIN-POT CLAVULANATE 875-125 MG PO TABS
1.0000 | ORAL_TABLET | Freq: Two times a day (BID) | ORAL | 0 refills | Status: DC
Start: 2023-05-18 — End: 2023-08-02

## 2023-05-18 MED ORDER — SODIUM CHLORIDE 0.9 % IV SOLN
2.0000 g | INTRAVENOUS | Status: DC
  Administered 2023-05-18: 2 g via INTRAVENOUS
  Filled 2023-05-18: qty 20

## 2023-05-18 NOTE — ED Notes (Signed)
RT Note: Upon assessing the patient there was no wheezing noted. Patient was clear and diminished in the bases. Patient stated he used an Albuterol inhaler earlier that was given from the doctor's office and it did nothing

## 2023-05-18 NOTE — ED Notes (Signed)
Pt had 1g tylenol po at 5pm and 600mg  Ibuprofen at 4pm

## 2023-05-18 NOTE — ED Provider Notes (Signed)
[facility] EMERGENCY DEPARTMENT AT [facility] Provider Note   CSN: [id number] Arrival date & time: 05/18/23  1814     History  No chief complaint on file.   Kevin Moon is a 37 y.o. male.  37 year old male previously healthy presents emergency department with cough and fever.  Patient reports since Sunday he has been having cough, chest tightness, and congestion.  Went to urgent care today and had a COVID and flu test that were negative.  He was prescribed decongestants, albuterol, and doxycycline for suspected community-acquired pneumonia.  Went home and says that his fevers kept worsening and he felt worse and worse.  Took Tylenol and ibuprofen with last dose around 530 pm.  Decided to come to the emergency department for evaluation.  Did have a friend that had walking pneumonia recently.       Home Medications Prior to Admission medications   Medication Sig Start Date End Date Taking? Authorizing Provider  amoxicillin-clavulanate (AUGMENTIN) 875-125 MG tablet Take 1 tablet by mouth every 12 (twelve) hours. 05/18/23  Yes Rondel Baton, MD  acetaminophen (TYLENOL 8 HOUR) 650 MG CR tablet Take 1 tablet (650 mg total) by mouth every 8 (eight) hours as needed for pain or fever. Patient not taking: Reported on 03/16/2023 12/16/18   Petrucelli, Samantha R, PA-C  albuterol (VENTOLIN HFA) 108 (90 Base) MCG/ACT inhaler Inhale 2 puffs into the lungs every 4 (four) hours as needed. 05/18/23     benzonatate (TESSALON) 100 MG capsule Take 1 capsule (100 mg total) by mouth every 8 (eight) hours. Patient not taking: Reported on 03/16/2023 12/16/18   Petrucelli, Pleas Koch, PA-C  benzonatate (TESSALON) 200 MG capsule Take 1 capsule (200 mg total) by mouth 3 (three) times daily as needed. Do not chew or cut. 05/17/23     doxycycline (VIBRA-TABS) 100 MG tablet Take 1 tablet (100 mg total) by mouth 2 (two) times daily. Take with 8 oz water. Do not lie down for at least 30 minutes after.  05/18/23     famotidine (PEPCID) 20 MG tablet Take 1 tablet (20 mg total) by mouth 2 (two) times daily. Patient not taking: Reported on 03/16/2023 09/09/21   Arby Barrette, MD  ibuprofen (ADVIL) 200 MG tablet Take 200 mg by mouth every 6 (six) hours as needed for moderate pain.    [provider]  ipratropium (ATROVENT) 0.06 % nasal spray Place 2 sprays into the nose 3 (three) times daily as needed. 05/17/23     loperamide (IMODIUM) 2 MG capsule Take 1 capsule (2 mg total) by mouth 4 (four) times daily as needed for diarrhea or loose stools. Patient not taking: Reported on 03/16/2023 09/09/21   Arby Barrette, MD  naproxen (NAPROSYN) 500 MG tablet naproxen 500 mg tablet  TAKE 1 TABLET BY MOUTH TWICE DAILY    [provider]  ondansetron (ZOFRAN) 4 MG tablet Take 1 tablet (4 mg total) by mouth every 4 (four) hours as needed for nausea or vomiting. Patient not taking: Reported on 03/16/2023 09/09/21   Arby Barrette, MD  promethazine-dextromethorphan (PROMETHAZINE-DM) 6.25-15 MG/5ML syrup Take 5-10 mLs by mouth every 6 (six) hours as needed for cough mainly at bedtime. Do not take and drive. 05/18/23     pseudoephedrine (SUDAFED) 120 MG 12 hr tablet Take 120 mg by mouth every 12 (twelve) hours as needed for congestion. Patient not taking: Reported on 03/16/2023    [provider]  Semaglutide, 1 MG/DOSE, (OZEMPIC, 1 MG/DOSE,) 4 MG/3ML SOPN Inject  1mg  [place] weekly 4mg /50ml pen Patient not taking: Reported on 03/16/2023 09/02/21     Semaglutide, 1 MG/DOSE, (OZEMPIC, 1 MG/DOSE,) 4 MG/3ML SOPN Inject 1 mg into the skin once a week. Patient not taking: Reported on 03/16/2023 03/15/22     Semaglutide,0.25 or 0.5MG /DOS, (OZEMPIC, 0.25 OR 0.5 MG/DOSE,) 2 MG/1.5ML SOPN Inject 0.5mg  weekly Patient not taking: Reported on 03/16/2023 08/04/21     Semaglutide,0.25 or 0.5MG /DOS, (OZEMPIC, 0.25 OR 0.5 MG/DOSE,) 2 MG/3ML SOPN Inject 0.25mg  [place] weekly x 2 wks then 0.5mg  [place] weekly Patient not  taking: Reported on 03/16/2023 02/03/22     Semaglutide,0.25 or 0.5MG /DOS, (OZEMPIC, 0.25 OR 0.5 MG/DOSE,) 2 MG/3ML SOPN Inject 0.5mg  [place] weekly Patient not taking: Reported on 03/16/2023 02/03/22     Semaglutide-Weight Management (WEGOVY) 0.25 MG/0.5ML SOAJ Inject 0.25 mg into the skin once a week for 48 doses. 03/16/23 02/09/24  [name], MD  vitamin C (ASCORBIC ACID) 500 MG tablet Take 1,000 mg by mouth daily. Patient not taking: Reported on 03/16/2023    [provider]      Allergies    Fluticasone    Review of Systems   Review of Systems  Physical Exam Updated Vital Signs BP 115/70   Pulse 80   Temp 99.4 F (37.4 C) (Oral)   Resp 16   Wt 113.4 kg   SpO2 97%   BMI 35.87 kg/m  Physical Exam Vitals and nursing note reviewed.  Constitutional:      General: He is not in acute distress.    Appearance: He is well-developed.  HENT:     Head: Normocephalic and atraumatic.     Right Ear: External ear normal.     Left Ear: External ear normal.     Nose: Nose normal.  Eyes:     Extraocular Movements: Extraocular movements intact.     Conjunctiva/sclera: Conjunctivae normal.     Pupils: Pupils are equal, round, and reactive to light.  Cardiovascular:     Rate and Rhythm: Regular rhythm. Tachycardia present.     Heart sounds: Normal heart sounds.  Pulmonary:     Effort: Pulmonary effort is normal. No respiratory distress.     Breath sounds: Rales (Bibasilar) present.  Musculoskeletal:     Cervical back: Normal range of motion and neck supple.     Right lower leg: No edema.     Left lower leg: No edema.  Skin:    General: Skin is warm and dry.  Neurological:     Mental Status: He is alert. Mental status is at baseline.  Psychiatric:        Mood and Affect: Mood normal.        Behavior: Behavior normal.     ED Results / Procedures / Treatments   Labs (all labs ordered are listed, but only abnormal results are displayed) Labs Reviewed  COMPREHENSIVE  METABOLIC PANEL - Abnormal; Notable for the following components:      Result Value   Glucose, Bld 107 (*)    All other components within normal limits  CBC WITH DIFFERENTIAL/PLATELET - Abnormal; Notable for the following components:   WBC 11.6 (*)    Neutro Abs 9.2 (*)    All other components within normal limits  URINALYSIS, W/ REFLEX TO CULTURE (INFECTION SUSPECTED) - Abnormal; Notable for the following components:   Specific Gravity, Urine 1.044 (*)    Ketones, ur TRACE (*)    Protein, ur 30 (*)    All other components within normal  limits  CULTURE, BLOOD (ROUTINE X 2)  CULTURE, BLOOD (ROUTINE X 2)  LACTIC ACID, PLASMA    EKG EKG Interpretation Date/Time:  Thursday May 18 2023 20:35:35 EST Ventricular Rate:  96 PR Interval:  153 QRS Duration:  98 QT Interval:  342 QTC Calculation: 433 R Axis:   -16  Text Interpretation: Sinus rhythm Borderline left axis deviation Abnormal R-wave progression, early transition Confirmed by Vonita Moss [phone]) on 05/18/2023 8:58:48 PM  Radiology DG Chest 2 View Result Date: 05/18/2023 CLINICAL DATA:  [address] with shortness of breath, recent pneumonia diagnosis. EXAM: CHEST - 2 VIEW COMPARISON:  CTA chest 12/19/2018 FINDINGS: The heart size and mediastinal contours are within normal limits. There is patchy airspace disease in the posterior basal right lower lobe consistent with pneumonia. Remaining lungs are clear. There is no substantial pleural effusion. The visualized skeletal structures are unremarkable. IMPRESSION: Patchy airspace disease in the posterior basal right lower lobe consistent with pneumonia. Follow-up radiographs recommended in 6-8 weeks to ensure resolution. Electronically Signed   By: Almira Bar M.D.   On: 05/18/2023 20:13    Procedures Procedures    Medications Ordered in ED Medications  sodium chloride 0.9 % bolus 1,000 mL (0 mLs Intravenous Stopped 05/18/23 2030)    ED Course/ Medical Decision Making/  A&P                                 Medical Decision Making Amount and/or Complexity of Data Reviewed Labs: ordered. Radiology: ordered. ECG/medicine tests: ordered.  Risk Prescription drug management.   Kevin Moon is a 37 y.o. male previously healthy presents to the emergency department with cough and a fever  Initial Ddx:  Pneumonia, URI, PE, sinusitis  MDM/Course:  Patient resents to the emergency department with cough and a fever.  He is previously healthy.  Was tachycardic and febrile on arrival so sepsis workup was initiated.  He was started on ceftriaxone and azithromycin due to concerns for pneumonia.  Chest x-ray confirmed that he had pneumonia.  He was given IV fluids as well.  Upon re-evaluation he was feeling much better.  Patient's fever resolved and heart rate was WNL.  Satting well on room air 90 respiratory distress.  Given the fact that he is young and healthy feel that he is suitable for outpatient treatment of his pneumonia.  Will have him follow-up with his primary doctor in several days as well.  Given a prescription for Augmentin and instructed to continue his doxycycline.  This patient presents to the ED for concern of complaints listed in HPI, this involves an extensive number of treatment options, and is a complaint that carries with it a high risk of complications and morbidity. Disposition including potential need for admission considered.   Dispo: DC Home. Return precautions discussed including, but not limited to, those listed in the AVS. Allowed pt time to ask questions which were answered fully prior to dc.  Additional history obtained from spouse Records reviewed Outpatient Clinic Notes The following labs were independently interpreted: CBC and show  leukocytosis concerning for infection I independently reviewed the following imaging with scope of interpretation limited to determining acute life threatening conditions related to emergency care: Chest  x-ray and agree with the radiologist interpretation with the following exceptions: none I personally reviewed and interpreted cardiac monitoring: normal sinus rhythm  and sinus tachycardia I personally reviewed and interpreted the pt's EKG: see above for interpretation  I have reviewed the patients home medications and made adjustments as needed  Portions of this note were generated with Dragon dictation software. Dictation errors may occur despite best attempts at proofreading.     Final Clinical Impression(s) / ED Diagnoses Final diagnoses:  Community acquired pneumonia, unspecified laterality    Rx / DC Orders ED Discharge Orders          Ordered    amoxicillin-clavulanate (AUGMENTIN) 875-125 MG tablet  Every 12 hours        05/18/23 2120              Rondel Baton, MD 05/19/23 1537

## 2023-05-18 NOTE — Sepsis Progress Note (Signed)
Elink monitoring for the code sepsis protocol.  

## 2023-05-18 NOTE — Sepsis Progress Note (Signed)
Notified bedside nurse of need to administer antibiotics.  

## 2023-05-18 NOTE — ED Triage Notes (Signed)
Pt was dx pneumonia today, started antibiotics today. He has been feeling worse as the day progressed, hard time catching his breath. He did take an albuterol inhaler x2 (given to him today) with no relief. Pt has taken a dose of antibiotic.pt took 600mg  ibuprofen at 4 and 2 tylenol at 5 because fever kept increasing.

## 2023-05-18 NOTE — Discharge Instructions (Signed)
You were seen for your pneumonia in the emergency department.   At home, please take the antibiotics (augmentin and doxycycline) you were prescribed.    Check your MyChart online for the results of any tests that had not resulted by the time you left the emergency department.   Follow-up with your primary doctor in 1 week regarding your visit.    Return immediately to the emergency department if you experience any of the following: difficulty breathing, or any other concerning symptoms.    Thank you for visiting our Emergency Department. It was a pleasure taking care of you today.

## 2023-05-18 NOTE — ED Notes (Signed)
Pt ambulated to the bathroom to void.   

## 2023-05-18 NOTE — ED Notes (Signed)
Gave pt two cans of ginger ale and encouraged po intake.  Pt was able to ambulate to the bathroom to void and notes that he is feeling better at this time

## 2023-05-23 LAB — CULTURE, BLOOD (ROUTINE X 2)
Culture: NO GROWTH
Culture: NO GROWTH
Special Requests: ADEQUATE
Special Requests: ADEQUATE

## 2023-06-30 ENCOUNTER — Other Ambulatory Visit (HOSPITAL_BASED_OUTPATIENT_CLINIC_OR_DEPARTMENT_OTHER): Payer: Self-pay

## 2023-07-05 ENCOUNTER — Other Ambulatory Visit (HOSPITAL_BASED_OUTPATIENT_CLINIC_OR_DEPARTMENT_OTHER): Payer: Self-pay

## 2023-07-05 ENCOUNTER — Ambulatory Visit: Payer: Managed Care, Other (non HMO) | Admitting: Internal Medicine

## 2023-07-05 VITALS — BP 135/82 | HR 86 | Temp 98.2°F | Ht 70.0 in | Wt 271.6 lb

## 2023-07-05 DIAGNOSIS — F50812 Binge eating disorder, severe: Secondary | ICD-10-CM

## 2023-07-05 DIAGNOSIS — E65 Localized adiposity: Secondary | ICD-10-CM | POA: Diagnosis not present

## 2023-07-05 DIAGNOSIS — E782 Mixed hyperlipidemia: Secondary | ICD-10-CM

## 2023-07-05 MED ORDER — ROSUVASTATIN CALCIUM 40 MG PO TABS
40.0000 mg | ORAL_TABLET | Freq: Every day | ORAL | 3 refills | Status: AC
  Filled 2023-07-05: qty 90, 90d supply, fill #0
  Filled 2024-01-24 – 2024-02-26 (×2): qty 90, 90d supply, fill #1

## 2023-07-05 NOTE — Patient Instructions (Signed)
Consider getting an Apple Watch or at least using an app on your phone if you have sleep apnea Consider using online compounding pharmacy to get tirzepatide for weight loss.Marland Kitchen less side effect(s) than HYQMVH Cholesterol medicine if  give you cramps you can stop but try lower doses first. I am sending you to a psychiatrist to try to confirm binge eating disorder and ADHD which could open up some really helpful medications for weight loss and focus

## 2023-07-05 NOTE — Progress Notes (Signed)
==============================  [facility]: [phone]   -- Medical Office Visit --  Patient: Kevin Moon      Age: 38 y.o.       Sex:  male  Date:   07/05/2023 Today's Healthcare Provider: [name], MD  ==============================   CHIEF COMPLAINT: Discuss medication and Stress (Grandfather died, mother-in-law on hospice. )  SUBJECTIVE: Background This is a 38 y.o. male who has Central adiposity; Hyperlipidemia; Family history of arteriosclerotic cardiovascular disease; and Allergic rhinitis on their problem list.  Plan today was to  - Reassess lipid levels and evaluate efficacy of interventions, but he reports he has been stress eating and not improving diet/exercise due to situation with family stress/grief, and cholesterol won't be improved. So we decided against lab recheck. - Discuss sleep apnea screening results from [facility] app: he had forgotten about doing this but we discussed the importance of determining if he has this because known snoring. - Evaluate progress with lifestyle modifications- hasn't made any,due to stress, but very motivated to lose weight.  We discussed how obstructive sleep apnea could qualify him for glp 1 for weight loss.  ## Up-to-Date Guidelines: 1. AHA/ACC 2018: Consider moderate-intensity statin for patients with LDL-C 70-189 mg/dL and [id number] ASCVD risk of 7.5% to 19.9%.... due to young age, and failure of arteriosclerotic cardiovascular disease models to assess his weight and family history he is listed at 1.8% 10 year risk.  I explained pathophysiology of plaque development and he decided to try rosuvastatin anyway. 2. USPSTF 2022: Recommend behavioral counseling to promote healthy diet and physical activity for CVD prevention. We did this today  ## Personal Reminders: - Patient had adverse reaction to Ozempic at higher doses, so we prefer to get him on tirzepatide.   Current Outpatient  Medications on File Prior to Visit  Medication Sig   albuterol (VENTOLIN HFA) 108 (90 Base) MCG/ACT inhaler Inhale 2 puffs into the lungs every 4 (four) hours as needed.   ipratropium (ATROVENT) 0.06 % nasal spray Place 2 sprays into the nose 3 (three) times daily as needed.   Semaglutide-Weight Management (WEGOVY) 0.25 MG/0.5ML SOAJ Inject 0.25 mg into the skin once a week for 48 doses.   acetaminophen (TYLENOL 8 HOUR) 650 MG CR tablet Take 1 tablet (650 mg total) by mouth every 8 (eight) hours as needed for pain or fever. (Patient not taking: Reported on 07/05/2023)   amoxicillin-clavulanate (AUGMENTIN) 875-125 MG tablet Take 1 tablet by mouth every 12 (twelve) hours. (Patient not taking: Reported on 07/05/2023)   benzonatate (TESSALON) 100 MG capsule Take 1 capsule (100 mg total) by mouth every 8 (eight) hours. (Patient not taking: Reported on 07/05/2023)   doxycycline (VIBRA-TABS) 100 MG tablet Take 1 tablet (100 mg total) by mouth 2 (two) times daily. Take with 8 oz water. Do not lie down for at least 30 minutes after. (Patient not taking: Reported on 07/05/2023)   famotidine (PEPCID) 20 MG tablet Take 1 tablet (20 mg total) by mouth 2 (two) times daily. (Patient not taking: Reported on 07/05/2023)   ibuprofen (ADVIL) 200 MG tablet Take 200 mg by mouth every 6 (six) hours as needed for moderate pain. (Patient not taking: Reported on 07/05/2023)   loperamide (IMODIUM) 2 MG capsule Take 1 capsule (2 mg total) by mouth 4 (four) times daily as needed for diarrhea or loose stools. (Patient not taking: Reported on 07/05/2023)   naproxen (NAPROSYN) 500 MG tablet naproxen 500 mg tablet  TAKE  1 TABLET BY MOUTH TWICE DAILY (Patient not taking: Reported on 07/05/2023)   ondansetron (ZOFRAN) 4 MG tablet Take 1 tablet (4 mg total) by mouth every 4 (four) hours as needed for nausea or vomiting. (Patient not taking: Reported on 07/05/2023)   promethazine-dextromethorphan (PROMETHAZINE-DM) 6.25-15 MG/5ML syrup Take 5-10  mLs by mouth every 6 (six) hours as needed for cough mainly at bedtime. Do not take and drive. (Patient not taking: Reported on 07/05/2023)   pseudoephedrine (SUDAFED) 120 MG 12 hr tablet Take 120 mg by mouth every 12 (twelve) hours as needed for congestion. (Patient not taking: Reported on 03/16/2023)   Semaglutide, 1 MG/DOSE, (OZEMPIC, 1 MG/DOSE,) 4 MG/3ML SOPN Inject 1mg  [place] weekly 4mg /97ml pen (Patient not taking: Reported on 07/05/2023)   Semaglutide, 1 MG/DOSE, (OZEMPIC, 1 MG/DOSE,) 4 MG/3ML SOPN Inject 1 mg into the skin once a week. (Patient not taking: Reported on 07/05/2023)   Semaglutide,0.25 or 0.5MG /DOS, (OZEMPIC, 0.25 OR 0.5 MG/DOSE,) 2 MG/1.5ML SOPN Inject 0.5mg  weekly (Patient not taking: Reported on 07/05/2023)   Semaglutide,0.25 or 0.5MG /DOS, (OZEMPIC, 0.25 OR 0.5 MG/DOSE,) 2 MG/3ML SOPN Inject 0.25mg  [place] weekly x 2 wks then 0.5mg  [place] weekly (Patient not taking: Reported on 07/05/2023)   Semaglutide,0.25 or 0.5MG /DOS, (OZEMPIC, 0.25 OR 0.5 MG/DOSE,) 2 MG/3ML SOPN Inject 0.5mg  [place] weekly (Patient not taking: Reported on 07/05/2023)   vitamin C (ASCORBIC ACID) 500 MG tablet Take 1,000 mg by mouth daily. (Patient not taking: Reported on 03/16/2023)   No current facility-administered medications on file prior to visit.   Medications Discontinued During This Encounter  Medication Reason   benzonatate (TESSALON) 200 MG capsule Patient Preference      Objective   Physical Exam     07/05/2023    3:43 PM 07/05/2023    3:33 PM 05/18/2023    9:15 PM  Vitals with BMI  Height  5\' 10"    Weight  271 lbs 10 oz   BMI  38.97   Systolic 135 146 161  Diastolic 82 86 70  Pulse  86 80   Wt Readings from Last 10 Encounters:  07/05/23 271 lb 9.6 oz (123.2 kg)  05/18/23 250 lb (113.4 kg)  03/16/23 258 lb 9.6 oz (117.3 kg)  09/09/21 239 lb (108.4 kg)  12/16/18 245 lb (111.1 kg)   Vital signs reviewed.  Nursing notes reviewed. Weight trend reviewed. Abnormalities and Problem-Specific physical exam  findings:  truncal adiposity full battle gear fits tight.  General Appearance:  No acute distress appreciable.   Well-groomed, healthy-appearing male.  Well proportioned with no abnormal fat distribution.  Good muscle tone. Pulmonary:  Normal work of breathing at rest, no respiratory distress apparent. SpO2: 95 %  Musculoskeletal: All extremities are intact.  Neurological:  Awake, alert, oriented, and engaged.  No obvious focal neurological deficits or cognitive impairments.  Sensorium seems unclouded.   Speech is clear and coherent with logical content. Psychiatric:  Appropriate mood, pleasant and cooperative demeanor, thoughtful and engaged during the exam    No results found for any visits on 07/05/23. Admission on 05/18/2023, Discharged on 05/18/2023  Component Date Value   Lactic Acid, Venous 05/18/2023 1.7    Sodium 05/18/2023 140    Potassium 05/18/2023 3.7    Chloride 05/18/2023 106    CO2 05/18/2023 24    Glucose, Bld 05/18/2023 107 (H)    BUN 05/18/2023 16    Creatinine, Ser 05/18/2023 1.06    Calcium 05/18/2023 9.3    Total Protein 05/18/2023 7.0  Albumin 05/18/2023 4.1    AST 05/18/2023 20    ALT 05/18/2023 9    Alkaline Phosphatase 05/18/2023 69    Total Bilirubin 05/18/2023 0.6    GFR, Estimated 05/18/2023 >60    Anion gap 05/18/2023 10    WBC 05/18/2023 11.6 (H)    RBC 05/18/2023 4.82    Hemoglobin 05/18/2023 14.8    HCT 05/18/2023 43.4    MCV 05/18/2023 90.0    MCH 05/18/2023 30.7    MCHC 05/18/2023 34.1    RDW 05/18/2023 13.1    Platelets 05/18/2023 174    nRBC 05/18/2023 0.0    Neutrophils Relative % 05/18/2023 78    Neutro Abs 05/18/2023 9.2 (H)    Lymphocytes Relative 05/18/2023 10    Lymphs Abs 05/18/2023 1.1    Monocytes Relative 05/18/2023 9    Monocytes Absolute 05/18/2023 1.0    Eosinophils Relative 05/18/2023 2    Eosinophils Absolute 05/18/2023 0.2    Basophils Relative 05/18/2023 1    Basophils Absolute 05/18/2023 0.1    Immature  Granulocytes 05/18/2023 0    Abs Immature Granulocytes 05/18/2023 0.04    Specimen Source 05/18/2023 URINE, CLEAN CATCH    Color, Urine 05/18/2023 YELLOW    APPearance 05/18/2023 CLEAR    Specific Gravity, Urine 05/18/2023 1.044 (H)    pH 05/18/2023 6.0    Glucose, UA 05/18/2023 NEGATIVE    Hgb urine dipstick 05/18/2023 NEGATIVE    Bilirubin Urine 05/18/2023 NEGATIVE    Ketones, ur 05/18/2023 TRACE (A)    Protein, ur 05/18/2023 30 (A)    Nitrite 05/18/2023 NEGATIVE    Leukocytes,Ua 05/18/2023 NEGATIVE    RBC / HPF 05/18/2023 0-5    WBC, UA 05/18/2023 0-5    Bacteria, UA 05/18/2023 NONE SEEN    Squamous Epithelial / HPF 05/18/2023 0-5    Mucus 05/18/2023 PRESENT    Specimen Description 05/18/2023                     Value:BLOOD BLOOD RIGHT HAND Performed at [facility], [address] [place], [place] [address]    Special Requests 05/18/2023                     Value:BOTTLES DRAWN AEROBIC AND ANAEROBIC Blood Culture adequate volume Performed at [facility], [address], [place], [place] [address]    Culture 05/18/2023                     Value:NO GROWTH 5 DAYS Performed at [facility], [address] [place], [place] [address]    Report Status 05/18/2023 05/23/2023 FINAL    Specimen Description 05/18/2023                     Value:BLOOD RIGHT ANTECUBITAL Performed at [facility], [address] [place], [place] [address]    Special Requests 05/18/2023                     Value:BOTTLES DRAWN AEROBIC AND ANAEROBIC Blood Culture adequate volume Performed at [facility], [address] [place], [place] [address]    Culture 05/18/2023                     Value:NO GROWTH 5 DAYS Performed at [facility], [address] [place], [place] [address]    Report Status 05/18/2023 05/23/2023 FINAL   Office Visit on 03/16/2023  Component Date Value   WBC 03/16/2023 7.3    RBC  03/16/2023 5.29    Hemoglobin 03/16/2023 15.9    HCT 03/16/2023 48.3    MCV 03/16/2023 91.3    MCHC 03/16/2023 32.8    RDW 03/16/2023 13.6    Platelets 03/16/2023 195.0    Neutrophils Relative % 03/16/2023 61.3    Lymphocytes Relative 03/16/2023 28.1    Monocytes Relative 03/16/2023 7.5    Eosinophils Relative 03/16/2023 2.8    Basophils Relative 03/16/2023 0.3    Neutro Abs 03/16/2023 4.5    Lymphs Abs 03/16/2023 2.1    Monocytes Absolute 03/16/2023 0.5    Eosinophils Absolute 03/16/2023 0.2    Basophils Absolute 03/16/2023 0.0    Sodium 03/16/2023 139    Potassium 03/16/2023 4.5    Chloride 03/16/2023 103    CO2 03/16/2023 30    Glucose, Bld 03/16/2023 93    BUN 03/16/2023 18    Creatinine, Ser 03/16/2023 0.96    Total Bilirubin 03/16/2023 0.7    Alkaline Phosphatase 03/16/2023 71    AST 03/16/2023 20    ALT 03/16/2023 9    Total Protein 03/16/2023 6.9    Albumin 03/16/2023 4.5    GFR 03/16/2023 99.85    Calcium 03/16/2023 9.8    Cholesterol 03/16/2023 235 (H)    Triglycerides 03/16/2023 263.0 (H)    HDL 03/16/2023 46.60    VLDL 03/16/2023 52.6 (H)    LDL Cholesterol 03/16/2023 136 (H)    Total CHOL/HDL Ratio 03/16/2023 5    NonHDL 03/16/2023 188.17    TSH 03/16/2023 0.94    Hgb A1c MFr Bld 03/16/2023 5.3   No image results found. DG Chest 2 View Result Date: 05/18/2023 CLINICAL DATA:  [address] with shortness of breath, recent pneumonia diagnosis. EXAM: CHEST - 2 VIEW COMPARISON:  CTA chest 12/19/2018 FINDINGS: The heart size and mediastinal contours are within normal limits. There is patchy airspace disease in the posterior basal right lower lobe consistent with pneumonia. Remaining lungs are clear. There is no substantial pleural effusion. The visualized skeletal structures are unremarkable. IMPRESSION: Patchy airspace disease in the posterior basal right lower lobe consistent with pneumonia. Follow-up radiographs recommended in 6-8 weeks to ensure resolution.  Electronically Signed   By: Almira Bar M.D.   On: 05/18/2023 20:13  DG Chest 2 View Result Date: 05/18/2023 CLINICAL DATA:  [address] with shortness of breath, recent pneumonia diagnosis. EXAM: CHEST - 2 VIEW COMPARISON:  CTA chest 12/19/2018 FINDINGS: The heart size and mediastinal contours are within normal limits. There is patchy airspace disease in the posterior basal right lower lobe consistent with pneumonia. Remaining lungs are clear. There is no substantial pleural effusion. The visualized skeletal structures are unremarkable. IMPRESSION: Patchy airspace disease in the posterior basal right lower lobe consistent with pneumonia. Follow-up radiographs recommended in 6-8 weeks to ensure resolution. Electronically Signed   By: Almira Bar M.D.   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  Also go online I     Orders Placed During this Encounter:   Orders Placed This Encounter  Procedures    Ambulatory referral to Psychiatry    Referral Priority:   Routine    Referral Type:   Psychiatric    Referral Reason:   Specialty Services Required    Requested Specialty:   Psychiatry    Number of Visits Requested:   1   Meds ordered this encounter  Medications   rosuvastatin (CRESTOR) 40 MG tablet    Sig: Take 1 tablet (40 mg total) by mouth daily.    Dispense:  90 tablet    Refill:  3       This document was synthesized by artificial intelligence (Abridge) using HIPAA-compliant recording of the clinical interaction;   We discussed the use of AI scribe software for clinical note transcription with the patient, who gave verbal consent to proceed.    Additional Info: This encounter employed state-of-the-art, real-time, collaborative documentation. The patient actively reviewed and assisted in updating their electronic medical record on a shared screen, ensuring transparency and facilitating joint problem-solving for the problem list, overview, and plan. This approach promotes accurate, informed care. The treatment plan was discussed and reviewed in detail, including medication safety, potential side effects, and all patient questions. We confirmed understanding and comfort with the plan. Follow-up instructions were established, including contacting the office for any concerns, returning if symptoms worsen, persist, or new symptoms develop, and precautions for potential emergency department visits.

## 2023-07-05 NOTE — Assessment & Plan Note (Signed)
He is concerned about this and would like to see monitor his appetite I strongly encouraged him to go online and get it from the website and also recommended getting sleep apnea diagnosis as he is snoring and this is an extra indication to get the medication

## 2023-07-05 NOTE — Assessment & Plan Note (Signed)
Although his calculated risk of ASCVD is only 1.8% we decided through shared decision making to try rosuvastatin if he can tolerate it okay based on family history weight and pathophysiology of plaque buildup not included in standard calculation

## 2023-07-29 ENCOUNTER — Encounter: Payer: Self-pay | Admitting: Internal Medicine

## 2023-08-02 ENCOUNTER — Other Ambulatory Visit (HOSPITAL_BASED_OUTPATIENT_CLINIC_OR_DEPARTMENT_OTHER): Payer: Self-pay

## 2023-08-02 ENCOUNTER — Telehealth: Payer: Managed Care, Other (non HMO) | Admitting: Physician Assistant

## 2023-08-02 ENCOUNTER — Encounter: Payer: Self-pay | Admitting: Physician Assistant

## 2023-08-02 VITALS — Temp 98.6°F

## 2023-08-02 DIAGNOSIS — J0101 Acute recurrent maxillary sinusitis: Secondary | ICD-10-CM

## 2023-08-02 MED ORDER — AMOXICILLIN-POT CLAVULANATE 875-125 MG PO TABS
1.0000 | ORAL_TABLET | Freq: Two times a day (BID) | ORAL | 0 refills | Status: AC
  Filled 2023-08-02: qty 14, 7d supply, fill #0

## 2023-08-02 NOTE — Progress Notes (Signed)
   Virtual Visit via Video Note  I connected with  Kevin Moon  on 08/02/23 at  9:30 AM EST by a video enabled telemedicine application and verified that I am speaking with the correct person using two identifiers.  Location: Patient: home Provider: Nature conservation officer at [employer] Persons present: Patient and myself   I discussed the limitations of evaluation and management by telemedicine and the availability of in person appointments. The patient expressed understanding and agreed to proceed.   History of Present Illness:  Discussed the use of AI scribe software for clinical note transcription with the patient, who gave verbal consent to proceed.  History of Present Illness   Kevin Moon is a 38 year old male with recurrent sinus infections who presents with sinus pain and pressure.  He has been experiencing significant sinus pain, pressure, and nasal drainage for approximately a week and a half. The nasal discharge is described as unpleasant. These symptoms occur about twice a year, typically with seasonal changes, and are identified as recurrent sinus infections.  He has a history of sinus infections and has previously discussed this with a physician. Antibiotics, particularly Augmentin, have been effective in the past, usually resolving the infection with a seven-day course. He uses nasal saline and an allergy spray regularly but admits to inconsistent use of allergy medication, especially before the onset of sinus symptoms.  No recent travel or exposure to others who are sick. No fever, chills, or flu-like symptoms. The only headache is due to sinus pressure, localized to the sinus area. He dislikes Flonase due to its floral scent but has no other medication intolerances.   Observations/Objective:   Gen: Awake, alert, no acute distress, congested sounding Resp: Breathing is even and non-labored ENT: Indicates pain and pressure over maxillary sinuses Psych:  calm/pleasant demeanor Neuro: Alert and Oriented x 3, + facial symmetry, speech is clear.   Assessment and Plan:  Assessment and Plan    Recurrent Sinusitis Seasonal sinus infection with sinus pain, pressure, and drainage for 1.5 weeks. No fever, chills, or other systemic symptoms. Patient has a history of recurrent sinusitis and is familiar with the symptoms. -Prescribe Augmentin for 7 days, to be taken with food. -Continue use of nasal saline and allergy spray. -Encourage consistent use of allergy medication, especially during sinus season. -Advise patient to return if symptoms worsen or change.        Follow Up Instructions:    I discussed the assessment and treatment plan with the patient. The patient was provided an opportunity to ask questions and all were answered. The patient agreed with the plan and demonstrated an understanding of the instructions.   The patient was advised to call back or seek an in-person evaluation if the symptoms worsen or if the condition fails to improve as anticipated.  Shenelle Klas M Leilanny Fluitt, PA-C

## 2023-10-05 ENCOUNTER — Telehealth: Admitting: Internal Medicine

## 2023-10-05 ENCOUNTER — Ambulatory Visit: Admitting: Internal Medicine

## 2023-10-26 ENCOUNTER — Other Ambulatory Visit (HOSPITAL_BASED_OUTPATIENT_CLINIC_OR_DEPARTMENT_OTHER): Payer: Self-pay

## 2023-10-26 ENCOUNTER — Encounter: Payer: Self-pay | Admitting: Internal Medicine

## 2023-10-26 ENCOUNTER — Ambulatory Visit: Admitting: Internal Medicine

## 2023-10-26 VITALS — BP 120/72 | HR 77 | Temp 98.0°F | Ht 70.0 in | Wt 266.8 lb

## 2023-10-26 DIAGNOSIS — M19042 Primary osteoarthritis, left hand: Secondary | ICD-10-CM

## 2023-10-26 DIAGNOSIS — R739 Hyperglycemia, unspecified: Secondary | ICD-10-CM | POA: Diagnosis not present

## 2023-10-26 DIAGNOSIS — E65 Localized adiposity: Secondary | ICD-10-CM | POA: Diagnosis not present

## 2023-10-26 DIAGNOSIS — E782 Mixed hyperlipidemia: Secondary | ICD-10-CM | POA: Diagnosis not present

## 2023-10-26 DIAGNOSIS — D72829 Elevated white blood cell count, unspecified: Secondary | ICD-10-CM

## 2023-10-26 DIAGNOSIS — M19041 Primary osteoarthritis, right hand: Secondary | ICD-10-CM | POA: Diagnosis not present

## 2023-10-26 LAB — COMPREHENSIVE METABOLIC PANEL WITH GFR
ALT: 12 U/L (ref 0–53)
AST: 22 U/L (ref 0–37)
Albumin: 4.5 g/dL (ref 3.5–5.2)
Alkaline Phosphatase: 68 U/L (ref 39–117)
BUN: 20 mg/dL (ref 6–23)
CO2: 27 meq/L (ref 19–32)
Calcium: 9.3 mg/dL (ref 8.4–10.5)
Chloride: 106 meq/L (ref 96–112)
Creatinine, Ser: 0.88 mg/dL (ref 0.40–1.50)
GFR: 108.16 mL/min (ref >60.00)
Glucose, Bld: 96 mg/dL (ref 70–99)
Potassium: 4.5 meq/L (ref 3.5–5.1)
Sodium: 139 meq/L (ref 135–145)
Total Bilirubin: 0.6 mg/dL (ref 0.2–1.2)
Total Protein: 7.2 g/dL (ref 6.0–8.3)

## 2023-10-26 LAB — CBC WITH DIFFERENTIAL/PLATELET
Basophils Absolute: 0 10*3/uL (ref 0.0–0.1)
Basophils Relative: 0.6 % (ref 0.0–3.0)
Eosinophils Absolute: 0.2 10*3/uL (ref 0.0–0.7)
Eosinophils Relative: 2.8 % (ref 0.0–5.0)
HCT: 45.2 % (ref 39.0–52.0)
Hemoglobin: 15.1 g/dL (ref 13.0–17.0)
Lymphocytes Relative: 26.2 % (ref 12.0–46.0)
Lymphs Abs: 2 10*3/uL (ref 0.7–4.0)
MCHC: 33.5 g/dL (ref 30.0–36.0)
MCV: 89.1 fl (ref 78.0–100.0)
Monocytes Absolute: 0.6 10*3/uL (ref 0.1–1.0)
Monocytes Relative: 7.5 % (ref 3.0–12.0)
Neutro Abs: 4.8 10*3/uL (ref 1.4–7.7)
Neutrophils Relative %: 62.9 % (ref 43.0–77.0)
Platelets: 178 10*3/uL (ref 150.0–400.0)
RBC: 5.08 Mil/uL (ref 4.22–5.81)
RDW: 13.5 % (ref 11.5–15.5)
WBC: 7.6 10*3/uL (ref 4.0–10.5)

## 2023-10-26 LAB — LIPID PANEL
Cholesterol: 137 mg/dL (ref 0–200)
HDL: 54.5 mg/dL (ref >39.00)
LDL Cholesterol: 63 mg/dL (ref 0–99)
NonHDL: 82.32
Total CHOL/HDL Ratio: 3
Triglycerides: 97 mg/dL (ref 0.0–149.0)
VLDL: 19.4 mg/dL (ref 0.0–40.0)

## 2023-10-26 LAB — HEMOGLOBIN A1C: Hgb A1c MFr Bld: 5.4 % (ref 4.6–6.5)

## 2023-10-26 MED ORDER — CELECOXIB 200 MG PO CAPS
200.0000 mg | ORAL_CAPSULE | Freq: Two times a day (BID) | ORAL | 3 refills | Status: AC
Start: 2023-10-26 — End: ?
  Filled 2023-10-26: qty 180, 90d supply, fill #0
  Filled 2024-02-26: qty 180, 90d supply, fill #1

## 2023-10-26 MED ORDER — DICLOFENAC SODIUM 1 % EX GEL
4.0000 g | Freq: Four times a day (QID) | CUTANEOUS | 11 refills | Status: AC | PRN
  Filled 2023-10-26: qty 100, 30d supply, fill #0

## 2023-10-26 NOTE — Assessment & Plan Note (Signed)
 Will order lab testing to guide management.  Lab Results  Component Value Date   HGBA1C 5.4 10/26/2023

## 2023-10-26 NOTE — Patient Instructions (Addendum)
 Metacarpophalangeal (MCP) joint arthritis affects the knuckles where the fingers meet the hand. It's often seen in conditions like rheumatoid arthritis, but can also result from osteoarthritis, post-traumatic arthritis, or gout. Therapy options span non-surgical and surgical interventions depending on severity, functional impact, and underlying cause.  ? Non-Surgical Therapy Options 1. Medications NSAIDs (e.g., ibuprofen, naproxen): Reduce inflammation and pain. Corticosteroids: Oral (for systemic inflammation). Intra-articular injections (temporary pain relief, especially in inflammatory arthritis). Disease-Modifying Antirheumatic Drugs (DMARDs):a Used primarily in rheumatoid arthritis (e.g., methotrexate, hydroxychloroquine). Biologics (e.g., adalimumab, etanercept): Targeted treatment for autoimmune causes. 2. Splinting and Bracing Static or dynamic splints can stabilize the MCP joint, reduce pain, and prevent deformities. Night splints help rest the joint and reduce morning stiffness. 3. Physical and Occupational Therapy Range of motion exercises: Prevent stiffness and preserve function. Strengthening exercises: Help maintain grip strength. Joint protection techniques: Teach patients how to modify activities to reduce joint stress. Adaptive devices: Tools to aid with daily living tasks (e.g., jar openers, button hooks). 4. Heat and Cold Therapy Heat: Increases blood flow and reduces stiffness (especially useful in the morning). Cold: Reduces inflammation and numbs pain (best after activity).  ??? Surgical Options Considered when conservative treatments fail and quality of life is significantly affected. 1. Synovectomy Removal of inflamed joint lining in rheumatoid arthritis. May relieve pain and slow progression if done early. 2. Joint Replacement (MCP Arthroplasty) Common in rheumatoid arthritis. Silicone, pyrocarbon, or metal prostheses. Improves function and reduces pain, but  may not restore full strength. 3. Joint Fusion (Arthrodesis) Permanently fuses the joint. Sacrifices motion for stability and pain relief. Preferred in post-traumatic arthritis or severely damaged joints. 4. Tendon Transfers or Reconstruction For advanced deformities (e.g., swan-neck or boutonnire) caused by rheumatoid arthritis.  ?? Experimental or Adjunct Therapies Platelet-Rich Plasma (PRP): Limited evidence in small joints like MCP. Stem Cell Therapy: Investigational and not widely used for hand arthritis. Ultrasound therapy: Sometimes used in physical therapy settings to reduce inflammation.  Summary Approach Options  Conservative NSAIDs, corticosteroids, splints, hand therapy, joint protection  Disease-specific DMARDs, biologics (RA-specific)  Minimally Invasive Cortisone injections  Surgical Synovectomy, arthroplasty, fusion, tendon transfer  Emerging PRP, stem cells, ultrasound   If you'd like, I can help you create a decision tree based on severity or diagnosis (RA vs OA vs trauma).

## 2023-10-26 NOTE — Progress Notes (Signed)
 ==============================  [facility]: [phone]   -- Medical Office Visit --  Patient: Kevin Moon      Age: 38 y.o.       Sex:  male  Date:   10/26/2023 Today's Healthcare Provider: Anthon Kins, MD  ==============================   Chief Complaint: Arthritis (Pt states hands and knees sharp pains in the joint and not able to grab anything at times.)  Discussed the use of AI scribe software for clinical note transcription with the patient, who gave verbal consent to proceed.  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  He has been experiencing worsening arthritis pain primarily in his hands and knees. Previously, he received a cortisone injection for knee pain. Recently, similar pain has developed in his hands, particularly affecting his ability to grip, especially with his right hand.  The pain in his hands is sharp and located on the dorsal side, particularly affecting the knuckles, with the middle knuckle being the most painful. The pain is bilateral but worse in the right hand. The pain is relieved by 'popping' his knuckles and worsens with gripping activities. No significant morning stiffness is reported, and the pain tends to worsen with use.  He has a family history of arthritis, as his father had arthritis in his hands for many years. There is no family history of rheumatoid arthritis or psoriasis. No associated rashes are present.  He uses ibuprofen and naproxen for pain relief when it becomes severe. He has not tried Voltaren rub or Celebrex yet. He is concerned about the impact of his pain on his work, which involves significant use of his hands, including physical altercations and firearms training.  No symptoms consistent with carpal tunnel syndrome, such as tingling or shooting pain. He has a  history of overuse of his hands and knees due to his occupation, which involves physical confrontations and firearms training.  Background Reviewed: Problem List: has Central adiposity; Hyperlipidemia; Family history of arteriosclerotic cardiovascular disease; Allergic rhinitis; Tear of right rotator cuff; Hyperglycemia; and Osteoarthritis of both hands on their problem list. Past Medical History:  has a past medical history of Allergies. Past Surgical History:   has a past surgical history that includes Rotator cuff repair (Right). Social History:   reports that he has never smoked. He has never used smokeless tobacco. He reports current alcohol use of about 6.0 standard drinks of alcohol per week. He reports that he does not use drugs. Family History:  family history includes Arthritis in his father; Birth defects in his daughter; Bladder Cancer in his paternal grandfather; Hearing loss in his father; Hyperlipidemia in his father and mother; Hypertension in his father; Stroke in his paternal grandmother. Allergies:  is allergic to fluticasone .   Medication Reconciliation: Current Outpatient Medications on File Prior to Visit  Medication Sig   ibuprofen (ADVIL) 200 MG tablet Take 200 mg by mouth every 6 (six) hours as needed for moderate pain (pain score 4-6).   ipratropium (ATROVENT ) 0.06 % nasal spray Place 2 sprays into the nose 3 (three) times daily as needed.   pseudoephedrine (SUDAFED) 120 MG 12 hr tablet Take 120 mg by mouth every 12 (twelve) hours as needed for congestion.   rosuvastatin  (CRESTOR ) 40 MG tablet Take 1 tablet (40 mg total) by mouth daily.   vitamin C (ASCORBIC ACID) 500 MG tablet Take 1,000 mg  by mouth daily.   No current facility-administered medications on file prior to visit.  There are no discontinued medications.   Physical Exam:    10/26/2023    8:03 AM 07/05/2023    3:43 PM 07/05/2023    3:33 PM  Vitals with BMI  Height 5\' 10"   5\' 10"   Weight 266 lbs 13 oz   271 lbs 10 oz  BMI 38.28  38.97  Systolic 120 135 409  Diastolic 72 82 86  Pulse 77  86  Vital signs reviewed.  Nursing notes reviewed. Weight trend reviewed. Physical Exam General Appearance:  No acute distress appreciable.   Well-groomed, healthy-appearing male.  Well proportioned with no abnormal fat distribution.  Good muscle tone. Pulmonary:  Normal work of breathing at rest, no respiratory distress apparent. SpO2: 98 %  Musculoskeletal: All extremities are intact.  Neurological:  Awake, alert, oriented, and engaged.  No obvious focal neurological deficits or cognitive impairments.  Sensorium seems unclouded.   Speech is clear and coherent with logical content. Psychiatric:  Appropriate mood, pleasant and cooperative demeanor, thoughtful and engaged during the exam Physical Exam MUSCULOSKELETAL: Negative Phalen's test. Location of pain over knuckles only.  Defers knee injections for knee pain- walks ok [place] ok without obvious knee pain affecting gait.      Results for orders placed or performed in visit on 10/26/23  Lipid panel  Result Value Ref Range   Cholesterol 137 0 - 200 mg/dL   Triglycerides 81.1 0.0 - 149.0 mg/dL   HDL 91.47 >82.95 mg/dL   VLDL 62.1 0.0 - 30.8 mg/dL   LDL Cholesterol 63 0 - 99 mg/dL   Total CHOL/HDL Ratio 3    NonHDL 82.32   Comprehensive metabolic panel with GFR  Result Value Ref Range   Sodium 139 135 - 145 mEq/L   Potassium 4.5 3.5 - 5.1 mEq/L   Chloride 106 96 - 112 mEq/L   CO2 27 19 - 32 mEq/L   Glucose, Bld 96 70 - 99 mg/dL   BUN 20 6 - 23 mg/dL   Creatinine, Ser 6.57 0.40 - 1.50 mg/dL   Total Bilirubin 0.6 0.2 - 1.2 mg/dL   Alkaline Phosphatase 68 39 - 117 U/L   AST 22 0 - 37 U/L   ALT 12 0 - 53 U/L   Total Protein 7.2 6.0 - 8.3 g/dL   Albumin 4.5 3.5 - 5.2 g/dL   GFR 846.96 >29.52 mL/min   Calcium  9.3 8.4 - 10.5 mg/dL  CBC with Differential/Platelet  Result Value Ref Range   WBC 7.6 4.0 - 10.5 K/uL   RBC 5.08 4.22 - 5.81 Mil/uL    Hemoglobin 15.1 13.0 - 17.0 g/dL   HCT 84.1 32.4 - 40.1 %   MCV 89.1 78.0 - 100.0 fl   MCHC 33.5 30.0 - 36.0 g/dL   RDW 02.7 25.3 - 66.4 %   Platelets 178.0 150.0 - 400.0 K/uL   Neutrophils Relative % 62.9 43.0 - 77.0 %   Lymphocytes Relative 26.2 12.0 - 46.0 %   Monocytes Relative 7.5 3.0 - 12.0 %   Eosinophils Relative 2.8 0.0 - 5.0 %   Basophils Relative 0.6 0.0 - 3.0 %   Neutro Abs 4.8 1.4 - 7.7 K/uL   Lymphs Abs 2.0 0.7 - 4.0 K/uL   Monocytes Absolute 0.6 0.1 - 1.0 K/uL   Eosinophils Absolute 0.2 0.0 - 0.7 K/uL   Basophils Absolute 0.0 0.0 - 0.1 K/uL  HgB A1c  Result Value Ref  Range   Hgb A1c MFr Bld 5.4 4.6 - 6.5 %   Office Visit on 10/26/2023  Component Date Value   Cholesterol 10/26/2023 137    Triglycerides 10/26/2023 97.0    HDL 10/26/2023 54.50    VLDL 10/26/2023 19.4    LDL Cholesterol 10/26/2023 63    Total CHOL/HDL Ratio 10/26/2023 3    NonHDL 10/26/2023 82.32    Sodium 10/26/2023 139    Potassium 10/26/2023 4.5    Chloride 10/26/2023 106    CO2 10/26/2023 27    Glucose, Bld 10/26/2023 96    BUN 10/26/2023 20    Creatinine, Ser 10/26/2023 0.88    Total Bilirubin 10/26/2023 0.6    Alkaline Phosphatase 10/26/2023 68    AST 10/26/2023 22    ALT 10/26/2023 12    Total Protein 10/26/2023 7.2    Albumin 10/26/2023 4.5    GFR 10/26/2023 108.16    Calcium  10/26/2023 9.3    WBC 10/26/2023 7.6    RBC 10/26/2023 5.08    Hemoglobin 10/26/2023 15.1    HCT 10/26/2023 45.2    MCV 10/26/2023 89.1    MCHC 10/26/2023 33.5    RDW 10/26/2023 13.5    Platelets 10/26/2023 178.0    Neutrophils Relative % 10/26/2023 62.9    Lymphocytes Relative 10/26/2023 26.2    Monocytes Relative 10/26/2023 7.5    Eosinophils Relative 10/26/2023 2.8    Basophils Relative 10/26/2023 0.6    Neutro Abs 10/26/2023 4.8    Lymphs Abs 10/26/2023 2.0    Monocytes Absolute 10/26/2023 0.6    Eosinophils Absolute 10/26/2023 0.2    Basophils Absolute 10/26/2023 0.0    Hgb A1c MFr Bld  10/26/2023 5.4   Admission on 05/18/2023, Discharged on 05/18/2023  Component Date Value   Lactic Acid, Venous 05/18/2023 1.7    Sodium 05/18/2023 140    Potassium 05/18/2023 3.7    Chloride 05/18/2023 106    CO2 05/18/2023 24    Glucose, Bld 05/18/2023 107 (H)    BUN 05/18/2023 16    Creatinine, Ser 05/18/2023 1.06    Calcium  05/18/2023 9.3    Total Protein 05/18/2023 7.0    Albumin 05/18/2023 4.1    AST 05/18/2023 20    ALT 05/18/2023 9    Alkaline Phosphatase 05/18/2023 69    Total Bilirubin 05/18/2023 0.6    GFR, Estimated 05/18/2023 >60    Anion gap 05/18/2023 10    WBC 05/18/2023 11.6 (H)    RBC 05/18/2023 4.82    Hemoglobin 05/18/2023 14.8    HCT 05/18/2023 43.4    MCV 05/18/2023 90.0    MCH 05/18/2023 30.7    MCHC 05/18/2023 34.1    RDW 05/18/2023 13.1    Platelets 05/18/2023 174    nRBC 05/18/2023 0.0    Neutrophils Relative % 05/18/2023 78    Neutro Abs 05/18/2023 9.2 (H)    Lymphocytes Relative 05/18/2023 10    Lymphs Abs 05/18/2023 1.1    Monocytes Relative 05/18/2023 9    Monocytes Absolute 05/18/2023 1.0    Eosinophils Relative 05/18/2023 2    Eosinophils Absolute 05/18/2023 0.2    Basophils Relative 05/18/2023 1    Basophils Absolute 05/18/2023 0.1    Immature Granulocytes 05/18/2023 0    Abs Immature Granulocytes 05/18/2023 0.04    Specimen Source 05/18/2023 URINE, CLEAN CATCH    Color, Urine 05/18/2023 YELLOW    APPearance 05/18/2023 CLEAR    Specific Gravity, Urine 05/18/2023 1.044 (H)    pH 05/18/2023 6.0  Glucose, UA 05/18/2023 NEGATIVE    Hgb urine dipstick 05/18/2023 NEGATIVE    Bilirubin Urine 05/18/2023 NEGATIVE    Ketones, ur 05/18/2023 TRACE (A)    Protein, ur 05/18/2023 30 (A)    Nitrite 05/18/2023 NEGATIVE    Leukocytes,Ua 05/18/2023 NEGATIVE    RBC / HPF 05/18/2023 0-5    WBC, UA 05/18/2023 0-5    Bacteria, UA 05/18/2023 NONE SEEN    Squamous Epithelial / HPF 05/18/2023 0-5    Mucus 05/18/2023 PRESENT    Specimen Description  05/18/2023                     Value:BLOOD BLOOD RIGHT HAND Performed at [facility], [address] [place], [place] [address]    Special Requests 05/18/2023                     Value:BOTTLES DRAWN AEROBIC AND ANAEROBIC Blood Culture adequate volume Performed at [facility], [address], [place], [place] [address]    Culture 05/18/2023                     Value:NO GROWTH 5 DAYS Performed at [facility], [address], [place], [place] [address]    Report Status 05/18/2023 05/23/2023 FINAL    Specimen Description 05/18/2023                     Value:BLOOD RIGHT ANTECUBITAL Performed at [facility], [address] [place], [place] [address]    Special Requests 05/18/2023                     Value:BOTTLES DRAWN AEROBIC AND ANAEROBIC Blood Culture adequate volume Performed at [facility], [address], [place], [place] [address]    Culture 05/18/2023                     Value:NO GROWTH 5 DAYS Performed at [facility], [address], [place], [place] [address]    Report Status 05/18/2023 05/23/2023 FINAL   Office Visit on 03/16/2023  Component Date Value   WBC 03/16/2023 7.3    RBC 03/16/2023 5.29    Hemoglobin 03/16/2023 15.9    HCT 03/16/2023 48.3    MCV 03/16/2023 91.3    MCHC 03/16/2023 32.8    RDW 03/16/2023 13.6    Platelets 03/16/2023 195.0    Neutrophils Relative % 03/16/2023 61.3    Lymphocytes Relative 03/16/2023 28.1    Monocytes Relative 03/16/2023 7.5    Eosinophils Relative 03/16/2023 2.8    Basophils Relative 03/16/2023 0.3    Neutro Abs 03/16/2023 4.5    Lymphs Abs 03/16/2023 2.1    Monocytes Absolute 03/16/2023 0.5    Eosinophils Absolute 03/16/2023 0.2    Basophils Absolute 03/16/2023 0.0    Sodium 03/16/2023 139    Potassium 03/16/2023 4.5    Chloride 03/16/2023 103    CO2 03/16/2023 30    Glucose, Bld 03/16/2023 93    BUN 03/16/2023 18     Creatinine, Ser 03/16/2023 0.96    Total Bilirubin 03/16/2023 0.7    Alkaline Phosphatase 03/16/2023 71    AST 03/16/2023 20    ALT 03/16/2023 9    Total Protein 03/16/2023 6.9    Albumin 03/16/2023 4.5    GFR 03/16/2023 99.85    Calcium  03/16/2023 9.8    Cholesterol 03/16/2023 235 (H)    Triglycerides 03/16/2023 263.0 (  H)    HDL 03/16/2023 46.60    VLDL 03/16/2023 52.6 (H)    LDL Cholesterol 03/16/2023 136 (H)    Total CHOL/HDL Ratio 03/16/2023 5    NonHDL 03/16/2023 188.17    TSH 03/16/2023 0.94    Hgb A1c MFr Bld 03/16/2023 5.3   No image results found. No results found.      10/26/2023    8:07 AM 03/16/2023    9:27 AM  PHQ 2/9 Scores  PHQ - 2 Score 0 0  PHQ- 9 Score 0    Results DIAGNOSTIC Phalen's test: Negative (10/26/2023)    Assessment & Plan Osteoarthritis of both hands, unspecified osteoarthritis type Chronic osteoarthritis affects his hands and knees, with more pain in the right hand, especially in the metacarpophalangeal joints. Pain is relieved by popping and worsened by gripping. The absence of significant morning stiffness suggests osteoarthritis rather than inflammatory arthritis. A negative Phalen's test rules out carpal tunnel syndrome. Knee pain occurs intermittently with strenuous activity, likely due to occupational wear and tear. Celebrex is preferred over ibuprofen and naproxen for its safety profile, though it poses risks to kidneys and heart with long-term use. Voltaren cream is recommended for its safety and topical application, though it requires frequent reapplication. Ergonomics and physical therapy are advised for long-term management. A hand surgeon referral will be considered if Celebrex is ineffective. Prescribe Celebrex for severe pain and stiffness days. Recommend Voltaren cream for topical pain relief as needed. Discuss the risks of NSAIDs, including potential kidney and heart issues with long-term use. Emphasize the importance of ergonomics and  potential physical therapy for long-term management. Order blood tests: A1c, cholesterol, blood counts, liver and kidney function tests. Mixed hyperlipidemia Will order lab testing to guide management.  Medications: not yet discussed Lab Results  Component Value Date   HDL 54.50 10/26/2023   HDL 46.60 03/16/2023   CHOLHDL 3 10/26/2023   CHOLHDL 5 03/16/2023   Lab Results  Component Value Date   LDLCALC 63 10/26/2023   LDLCALC 136 (H) 03/16/2023   Lab Results  Component Value Date   TRIG 97.0 10/26/2023   TRIG 263.0 (H) 03/16/2023   Lab Results  Component Value Date   CHOL 137 10/26/2023   CHOL 235 (H) 03/16/2023   The ASCVD Risk score (Arnett DK, et al., 2019) failed to calculate for the following reasons:   The 2019 ASCVD risk score is only valid for ages 65 to 33 Lab Results  Component Value Date   ALT 12 10/26/2023   AST 22 10/26/2023   ALKPHOS 68 10/26/2023   TSH 0.94 03/16/2023   HGBA1C 5.4 10/26/2023   Body mass index is 38.28 kg/m.  Lipoprotein(a), Apolipoprotein B (ApoB), and High-sensitivity C-reactive protein (hs-CRP) No results found for: "HSCRP", "LIPOA" Improving Your Cholesterol: Diet: Focus on a Mediterranean-style diet, limit saturated fats and sugars, and increase omega-3 fatty acids (fish, flaxseeds,nuts,extra virgin olive oil, avocados). Exercise: Engage in regular physical activity (aerobic exercises are particularly beneficial for HDL). Weight Management: Maintain a healthy weight.  Hyperglycemia Will order lab testing to guide management.  Lab Results  Component Value Date   HGBA1C 5.4 10/26/2023    Central adiposity Will order lab testing to guide management.  Encouraged weight loss  Lab Results  Component Value Date   TSH 0.94 03/16/2023   Lab Results  Component Value Date   HGBA1C 5.4 10/26/2023   Wt Readings from Last 10 Encounters:  10/26/23 266 lb 12.8 oz (121 kg)  07/05/23  271 lb 9.6 oz (123.2 kg)  05/18/23 250 lb (113.4  kg)  03/16/23 258 lb 9.6 oz (117.3 kg)  09/09/21 239 lb (108.4 kg)  12/16/18 245 lb (111.1 kg)  Body mass index is 38.28 kg/m.  Leukocytosis, unspecified type Will order lab testing to guide management  Lab Results  Component Value Date/Time   WBC 7.6 10/26/2023 08:52 AM   WBC 11.6 (H) 05/18/2023 06:28 PM   WBC 7.3 03/16/2023 10:27 AM   WBC 20.7 (H) 09/09/2021 06:33 PM   WBC 6.8 12/19/2018 11:22 AM        Orders Placed During this Encounter:   Orders Placed This Encounter  Procedures   Lipid panel    [place]    Has the patient fasted?:   No    Release to patient:   Immediate [1]   Comprehensive metabolic panel with GFR    Has the patient fasted?:   No    Release to patient:   Immediate [1]   CBC with Differential/Platelet    Release to patient:   Immediate [1]   HgB A1c   Meds ordered this encounter  Medications   celecoxib (CELEBREX) 200 MG capsule    Sig: Take 1 capsule (200 mg total) by mouth 2 (two) times daily.    Dispense:  180 capsule    Refill:  3   diclofenac Sodium (VOLTAREN) 1 % GEL    Sig: Apply 4 g topically 4 (four) times daily as needed.    Dispense:  100 g    Refill:  11     This document was synthesized by artificial intelligence ([employer]) using HIPAA-compliant recording of the clinical interaction;   We discussed the use of AI scribe software for clinical note transcription with the patient, who gave verbal consent to proceed. additional Info: This encounter employed state-of-the-art, real-time, collaborative documentation. The patient actively reviewed and assisted in updating their electronic medical record on a shared screen, ensuring transparency and facilitating joint problem-solving for the problem list, overview, and plan. This approach promotes accurate, informed care. The treatment plan was discussed and reviewed in detail, including medication safety, potential side effects, and all patient questions. We confirmed understanding and comfort with  the plan. Follow-up instructions were established, including contacting the office for any concerns, returning if symptoms worsen, persist, or new symptoms develop, and precautions for potential emergency department visits.

## 2023-10-26 NOTE — Assessment & Plan Note (Signed)
 Will order lab testing to guide management.  Medications: not yet discussed Lab Results  Component Value Date   HDL 54.50 10/26/2023   HDL 46.60 03/16/2023   CHOLHDL 3 10/26/2023   CHOLHDL 5 03/16/2023   Lab Results  Component Value Date   LDLCALC 63 10/26/2023   LDLCALC 136 (H) 03/16/2023   Lab Results  Component Value Date   TRIG 97.0 10/26/2023   TRIG 263.0 (H) 03/16/2023   Lab Results  Component Value Date   CHOL 137 10/26/2023   CHOL 235 (H) 03/16/2023   The ASCVD Risk score (Arnett DK, et al., 2019) failed to calculate for the following reasons:   The 2019 ASCVD risk score is only valid for ages 41 to 46 Lab Results  Component Value Date   ALT 12 10/26/2023   AST 22 10/26/2023   ALKPHOS 68 10/26/2023   TSH 0.94 03/16/2023   HGBA1C 5.4 10/26/2023   Body mass index is 38.28 kg/m.  Lipoprotein(a), Apolipoprotein B (ApoB), and High-sensitivity C-reactive protein (hs-CRP) No results found for: "HSCRP", "LIPOA" Improving Your Cholesterol: Diet: Focus on a Mediterranean-style diet, limit saturated fats and sugars, and increase omega-3 fatty acids (fish, flaxseeds,nuts,extra virgin olive oil, avocados). Exercise: Engage in regular physical activity (aerobic exercises are particularly beneficial for HDL). Weight Management: Maintain a healthy weight.

## 2023-10-26 NOTE — Assessment & Plan Note (Signed)
 Will order lab testing to guide management.  Encouraged weight loss  Lab Results  Component Value Date   TSH 0.94 03/16/2023   Lab Results  Component Value Date   HGBA1C 5.4 10/26/2023   Wt Readings from Last 10 Encounters:  10/26/23 266 lb 12.8 oz (121 kg)  07/05/23 271 lb 9.6 oz (123.2 kg)  05/18/23 250 lb (113.4 kg)  03/16/23 258 lb 9.6 oz (117.3 kg)  09/09/21 239 lb (108.4 kg)  12/16/18 245 lb (111.1 kg)  Body mass index is 38.28 kg/m.

## 2023-10-26 NOTE — Assessment & Plan Note (Signed)
 Chronic osteoarthritis affects his hands and knees, with more pain in the right hand, especially in the metacarpophalangeal joints. Pain is relieved by popping and worsened by gripping. The absence of significant morning stiffness suggests osteoarthritis rather than inflammatory arthritis. A negative Phalen's test rules out carpal tunnel syndrome. Knee pain occurs intermittently with strenuous activity, likely due to occupational wear and tear. Celebrex is preferred over ibuprofen and naproxen for its safety profile, though it poses risks to kidneys and heart with long-term use. Voltaren cream is recommended for its safety and topical application, though it requires frequent reapplication. Ergonomics and physical therapy are advised for long-term management. A hand surgeon referral will be considered if Celebrex is ineffective. Prescribe Celebrex for severe pain and stiffness days. Recommend Voltaren cream for topical pain relief as needed. Discuss the risks of NSAIDs, including potential kidney and heart issues with long-term use. Emphasize the importance of ergonomics and potential physical therapy for long-term management. Order blood tests: A1c, cholesterol, blood counts, liver and kidney function tests.

## 2023-10-27 ENCOUNTER — Ambulatory Visit: Payer: Self-pay | Admitting: Internal Medicine

## 2024-01-16 ENCOUNTER — Other Ambulatory Visit (HOSPITAL_BASED_OUTPATIENT_CLINIC_OR_DEPARTMENT_OTHER): Payer: Self-pay

## 2024-01-16 MED ORDER — TESTOSTERONE CYPIONATE 200 MG/ML IM SOLN
220.0000 mg | INTRAMUSCULAR | 0 refills | Status: DC
  Filled 2024-01-16: qty 8, 28d supply, fill #0

## 2024-01-16 MED ORDER — SYRINGE/NEEDLE (DISP) 23G X 1" 3 ML MISC
1 refills | Status: AC
  Filled 2024-01-16: qty 8, 28d supply, fill #0

## 2024-01-24 ENCOUNTER — Other Ambulatory Visit: Payer: Self-pay

## 2024-01-24 ENCOUNTER — Encounter: Payer: Self-pay | Admitting: Pharmacist

## 2024-01-29 ENCOUNTER — Other Ambulatory Visit: Payer: Self-pay

## 2024-02-23 ENCOUNTER — Other Ambulatory Visit (HOSPITAL_BASED_OUTPATIENT_CLINIC_OR_DEPARTMENT_OTHER): Payer: Self-pay

## 2024-02-27 ENCOUNTER — Other Ambulatory Visit (HOSPITAL_BASED_OUTPATIENT_CLINIC_OR_DEPARTMENT_OTHER): Payer: Self-pay

## 2024-02-27 MED ORDER — TESTOSTERONE CYPIONATE 200 MG/ML IM SOLN
220.0000 mg | INTRAMUSCULAR | 2 refills | Status: AC
  Filled 2024-02-27: qty 8, 28d supply, fill #0

## 2024-03-20 ENCOUNTER — Ambulatory Visit (INDEPENDENT_AMBULATORY_CARE_PROVIDER_SITE_OTHER): Payer: Managed Care, Other (non HMO) | Admitting: Internal Medicine

## 2024-03-20 ENCOUNTER — Encounter: Payer: Self-pay | Admitting: Internal Medicine

## 2024-03-20 ENCOUNTER — Other Ambulatory Visit (HOSPITAL_BASED_OUTPATIENT_CLINIC_OR_DEPARTMENT_OTHER): Payer: Self-pay

## 2024-03-20 VITALS — BP 144/90 | HR 79 | Temp 98.4°F | Ht 70.0 in | Wt 269.8 lb

## 2024-03-20 DIAGNOSIS — Z0001 Encounter for general adult medical examination with abnormal findings: Secondary | ICD-10-CM | POA: Diagnosis not present

## 2024-03-20 DIAGNOSIS — E65 Localized adiposity: Secondary | ICD-10-CM | POA: Diagnosis not present

## 2024-03-20 DIAGNOSIS — R29818 Other symptoms and signs involving the nervous system: Secondary | ICD-10-CM

## 2024-03-20 DIAGNOSIS — H900 Conductive hearing loss, bilateral: Secondary | ICD-10-CM

## 2024-03-20 DIAGNOSIS — Z9289 Personal history of other medical treatment: Secondary | ICD-10-CM

## 2024-03-20 DIAGNOSIS — E782 Mixed hyperlipidemia: Secondary | ICD-10-CM

## 2024-03-20 LAB — LIPID PANEL
Cholesterol: 146 mg/dL (ref 0–200)
HDL: 42.1 mg/dL (ref >39.00)
LDL Cholesterol: 62 mg/dL (ref 0–99)
NonHDL: 103.84
Total CHOL/HDL Ratio: 3
Triglycerides: 209 mg/dL — ABNORMAL HIGH (ref 0.0–149.0)
VLDL: 41.8 mg/dL — ABNORMAL HIGH (ref 0.0–40.0)

## 2024-03-20 LAB — COMPREHENSIVE METABOLIC PANEL WITH GFR
ALT: 16 U/L (ref 0–53)
AST: 34 U/L (ref 0–37)
Albumin: 4.7 g/dL (ref 3.5–5.2)
Alkaline Phosphatase: 60 U/L (ref 39–117)
BUN: 20 mg/dL (ref 6–23)
CO2: 26 meq/L (ref 19–32)
Calcium: 9.6 mg/dL (ref 8.4–10.5)
Chloride: 105 meq/L (ref 96–112)
Creatinine, Ser: 1.23 mg/dL (ref 0.40–1.50)
GFR: 73.64 mL/min (ref >60.00)
Glucose, Bld: 91 mg/dL (ref 70–99)
Potassium: 4.6 meq/L (ref 3.5–5.1)
Sodium: 138 meq/L (ref 135–145)
Total Bilirubin: 0.5 mg/dL (ref 0.2–1.2)
Total Protein: 7 g/dL (ref 6.0–8.3)

## 2024-03-20 LAB — CBC WITH DIFFERENTIAL/PLATELET
Basophils Absolute: 0 K/uL (ref 0.0–0.1)
Basophils Relative: 0.4 % (ref 0.0–3.0)
Eosinophils Absolute: 0.2 K/uL (ref 0.0–0.7)
Eosinophils Relative: 1.6 % (ref 0.0–5.0)
HCT: 49.9 % (ref 39.0–52.0)
Hemoglobin: 16.4 g/dL (ref 13.0–17.0)
Lymphocytes Relative: 15.5 % (ref 12.0–46.0)
Lymphs Abs: 1.8 K/uL (ref 0.7–4.0)
MCHC: 32.9 g/dL (ref 30.0–36.0)
MCV: 89.9 fl (ref 78.0–100.0)
Monocytes Absolute: 1 K/uL (ref 0.1–1.0)
Monocytes Relative: 8.4 % (ref 3.0–12.0)
Neutro Abs: 8.4 K/uL — ABNORMAL HIGH (ref 1.4–7.7)
Neutrophils Relative %: 74.1 % (ref 43.0–77.0)
Platelets: 184 K/uL (ref 150.0–400.0)
RBC: 5.55 Mil/uL (ref 4.22–5.81)
RDW: 13.8 % (ref 11.5–15.5)
WBC: 11.3 K/uL — ABNORMAL HIGH (ref 4.0–10.5)

## 2024-03-20 MED ORDER — NEXLIZET 180-10 MG PO TABS
1.0000 | ORAL_TABLET | Freq: Every day | ORAL | 4 refills | Status: AC
  Filled 2024-03-20: qty 30, 30d supply, fill #0

## 2024-03-20 NOTE — Patient Instructions (Signed)
 VISIT SUMMARY: During your visit, we discussed alternatives to your current statin therapy, your concerns about potential side effects, and your family history of heart disease. We also reviewed your diet, exercise routine, and other health concerns, including sleep apnea, hearing loss, and general health maintenance.  YOUR PLAN: -MIXED HYPERLIPIDEMIA: Mixed hyperlipidemia means you have high levels of different types of fats in your blood. We discussed your interest in discontinuing rosuvastatin  due to potential side effects. Alternatives like bempedoic acid and Nexlizet were suggested, along with dietary changes such as increasing avocado consumption. Consider stopping rosuvastatin  and starting bempedoic acid and Nexlizet if you choose non-statin medications.  -OBESITY (CENTRAL ADIPOSITY): Central adiposity refers to excess fat around your abdomen. You are currently participating in a weight loss program with [facility] MD and aiming to exercise at least three days a week. Continue with your weight loss program and maintain regular exercise.  -POSSIBLE SLEEP APNEA: Sleep apnea is a condition where your breathing stops and starts during sleep. Given your family history and your wife's observation of your snoring, we discussed the potential link between untreated sleep apnea and Alzheimer's disease. Using an Apple Watch 10 or better for sleep apnea screening was recommended as a cost-effective alternative to a sleep study.  -HEARING LOSS: Your hearing loss is likely due to years of hunting without hearing protection, affecting your ability to hear high-pitched noises. No specific treatment was discussed during this visit.  -GENERAL HEALTH MAINTENANCE: We discussed the importance of exercise, diet, and regular health screenings. Basic lab work covered by [employer] for your annual physical was recommended, including a general health panel, cholesterol, metabolic panel, blood count, and thyroid tests.  Comprehensive lab panels from [facility] were also suggested every 5-10 years.  INSTRUCTIONS: Please follow up with the recommended basic lab work, including a general health panel, cholesterol, metabolic panel, blood count, and thyroid tests. Consider comprehensive lab panels from [facility] every 5-10 years. If you decide to discontinue rosuvastatin , start bempedoic acid and Nexlizet as discussed. Continue with your weight loss program and regular exercise. Use an Apple Watch 10 or better for sleep apnea screening.  Building Your Long-Term Health Plan  During today's preventive visit, we covered a variety of important health checks to help you stay on top of your well-being.  We also discussed strategies to maintain your health and identified some areas that might benefit from further exploration.   Preventive care visits like today's are designed to be proactive, but sometimes additional attention may be needed.  Rest assured, we're here for you.  If these areas require further evaluation or management, we'd be happy to schedule a separate, focused appointment to address them in detail.  Addressing Next Steps  [x]   Follow-up Visit: To ensure we address any unresolved issues and continue monitoring your overall health, we recommend scheduling a follow-up appointment in 1 year for your next preventive care visit. If you experience any new problems, need to discuss any medical concerns, or your condition worsens before then, please don't hesitate to call our office to schedule an appointment or seek emergency care as needed.  [x]   Preventive Measures: Maintaining healthy habits plays a crucial role in overall wellness. We recommend considering these tips: [x]   Regular appointments with dental and vision professionals [x]   Nightly nasal saline mist to keep sinuses clear [x]   Consistent toothbrushing to maintain oral health [x]   Using an app like SnoreLab to track sleep quality [x]   Routine checks of  blood pressure and  heart rate [x]   Medical Information: In some instances, we may require additional medical information from other providers to create a comprehensive picture of your health. If applicable, we can provide a medical information release form at the front desk for you to sign, allowing us  to gather these records. [x]   Lab Tests: If any lab tests were ordered today, scheduling them within a week of your visit helps ensure the best possible insurance coverage.  Planning Follow Up to Work on a Problem? Make the Most of Our Focused (20 minute) Appointments  [x]   Clearly state your top concerns at the beginning of the visit to focus our discussion [x]   If you anticipate you will need more time, please inform the front desk during scheduling - we can book multiple appointments in the same week. [x]   If you have transportation problems- use our convenient video appointments or ask about transportation support. [x]   We can get down to business faster if you use MyChart to update information before the visit and submit non-urgent questions before your visit. Thank you for taking the time to provide details through MyChart.  Let our nurse know and she can import this information into your encounter documents.  Arrival and Wait Times  [x]   Arriving on time ensures that everyone receives prompt attention. [x]   Early morning (8a) and afternoon (1p) appointments tend to have shortest wait times. [x]   Unfortunately, we cannot delay appointments for late arrivals or hold slots during phone calls.  Bring to Your Next Appointment:  [x]   Medications: Please bring all your medication bottles to your next appointment to ensure we have an accurate record of your prescriptions. [x]   Health Diaries: If you're monitoring any health conditions at home, keeping a diary of your readings can be very helpful for discussions at your next appointment.  Reviewing Your Records  [x]   Review your attached  preventive care information at the end of these patient instructions. [x]   Review this early draft of your clinical encounter notes below and the final encounter summary tomorrow on MyChart after its been completed.      Getting Answers and Following Up  [x]   Simple Questions & Concerns: For quick questions or basic follow-up after your visit, reach us  at [phone] or MyChart messaging. [x]   Complex Concerns: If your concern is more complex, scheduling an appointment might be best. Discuss this with the staff to find the most suitable option. [x]   Lab & Imaging Results: We'll contact you directly if results are abnormal or you don't use MyChart. Most normal results will be on MyChart within 2-3 business days, with a review message from Dr. Jesus. Haven't heard back in 2 weeks? Need results sooner? Contact us  at [phone]. [x]   Referrals: Our referral coordinator will manage specialist referrals. The specialist's office should contact you within 2 weeks to schedule an appointment. Call us  if you haven't heard from them after 2 weeks.  Staying Connected  [x]   MyChart: Activate your MyChart for the fastest way to access results and message us . See the last page of this paperwork for instructions on how to activate.  Billing  [x]   X-ray & Lab Orders: These are billed by separate companies. Contact the invoicing company directly for questions or concerns. [x]   Visit Charges: Discuss any billing inquiries with our administrative services team.  Your Satisfaction Matters  [x]   Share Your Experience: We strive for your satisfaction! If you have any complaints, or preferably compliments, please let  Dr. Jesus know directly or contact our Practice Administrators, Manuelita Rubin or [employer], by asking at the front desk.                 Next Steps  [x]   Schedule Follow-Up:  We recommend a follow-up appointment in 1 year for your next wellness visit.  If you develop any  new problems, want to address any medical issues, or your condition worsens before then, please call us  for an appointment or seek emergency care. [x]   Preventive Care:  Make sure to keep regular appointments with dental and vision professionals, use nightly nasal saline mist sprays to keep your sinuses clear and toothbrushing to protect your teeth. Use SnoreLab App or other app to track your sleep quality. Check blood pressure and heart rate routinely. [x]   Medical Information Release:  For any relevant medical information we don't have, please sign a release form at the front desk so we can obtain it for your records. [x]   Lab Tests:  Schedule any lab tests from today for within a week to ensure best insurance coverage.    Making the Most of Our Focused (20 minute) Appointments:  [x]   Clearly state your top concerns at the beginning of the visit to focus our discussion [x]   If you anticipate you will need more time, please inform the front desk during scheduling - we can book multiple appointments in the same week. [x]   If you have transportation problems- use our convenient video appointments or ask about transportation support. [x]   We can get down to business faster if you use MyChart to update information before the visit and submit non-urgent questions before your visit. Thank you for taking the time to provide details through MyChart.  Let our nurse know and she can import this information into your encounter documents.  Arrival and Wait Times: [x]   Arriving on time ensures that everyone receives prompt attention. [x]   Early morning (8a) and afternoon (1p) appointments tend to have shortest wait times. [x]   Unfortunately, we cannot delay appointments for late arrivals or hold slots during phone calls.  Bring to Your Next Appointment  [x]   Medications: Please bring all your medication bottles to your next appointment to ensure we have an accurate record of your prescriptions. [x]   Health  Diaries: If you're monitoring any health conditions at home, keeping a diary of your readings can be very helpful for discussions at your next appointment.  Reviewing Your Records  [x]   Review your attached preventive care information at the end of these patient instructions. [x]   Review this early draft of your clinical encounter notes below and the final encounter summary tomorrow on MyChart after its been completed.   Mixed hyperlipidemia Assessment & Plan: Mixed Hyperlipidemia   He is on rosuvastatin  40 mg daily, with a family history of myocardial infarction as a significant risk factor. He is interested in discontinuing the statin due to his wife's concerns. Alternative options, including dietary changes and non-statin medications, were discussed. Avocado consumption is suggested. Bempedoic acid and Nexlizet are discussed as alternatives with fewer side effects. The non-family history risk is less than 1% over the next ten years. Consider discontinuing rosuvastatin . Prescribe bempedoic acid and Nexlizet if opting for non-statin medication. Encourage dietary changes, including a high protein, low fat, low carb diet, and increased avocado consumption.  Orders: -     Nexlizet; Take 1 tablet by mouth daily at 6 (six) AM.  Dispense: 90 tablet; Refill: 4 -  Lipid panel  Encounter for annual general medical examination with abnormal findings in adult -     Nexlizet; Take 1 tablet by mouth daily at 6 (six) AM.  Dispense: 90 tablet; Refill: 4 -     Lipid panel -     Comprehensive metabolic panel with GFR -     CBC with Differential/Platelet -     TSH Rfx on Abnormal to Free T4  History of positive PPD  Central adiposity Assessment & Plan: Obesity (Central Adiposity)   He is engaging in a weight loss program with [facility] MD and attempting to exercise at least three days a week. The importance of diet and exercise in managing central adiposity was discussed. Continue the weight loss program  with [facility] MD and encourage regular exercise, aiming for at least three days a week.   Suspected sleep apnea  Conductive hearing loss, bilateral     Getting Answers and Following Up  [x]   Simple Questions & Concerns: For quick questions or basic follow-up after your visit, reach us  at [phone] or MyChart messaging. [x]   Complex Concerns: If your concern is more complex, scheduling an appointment might be best. Discuss this with the staff to find the most suitable option. [x]   Lab & Imaging Results: We'll contact you directly if results are abnormal or you don't use MyChart. Most normal results will be on MyChart within 2-3 business days, with a review message from Dr. Jesus. Haven't heard back in 2 weeks? Need results sooner? Contact us  at [phone]. [x]   Referrals: Our referral coordinator will manage specialist referrals. The specialist's office should contact you within 2 weeks to schedule an appointment. Call us  if you haven't heard from them after 2 weeks.  Staying Connected  [x]   MyChart: Activate your MyChart for the fastest way to access results and message us . See the last page of this paperwork for instructions on how to activate.  Billing  [x]   X-ray & Lab Orders: These are billed by separate companies. Contact the invoicing company directly for questions or concerns. [x]   Visit Charges: Discuss any billing inquiries with our administrative services team.  Your Satisfaction Matters  [x]   Share Your Experience: We strive for your satisfaction! If you have any complaints, or preferably compliments, please let Dr. Jesus know directly or contact our Practice Administrators, Manuelita Rubin or [employer], by asking at the front desk.    Medical Screening Exam A medical screening exam (MSE) helps to determine whether you need immediate medical treatment relating to any number of symptoms you are having. This type of exam may be done in an emergency  department, an urgent care setting, or your health care provider's office. Depending on your symptoms and severity, you may need additional tests or medical therapy. It is important to note that an MSE does not necessarily mean that you will need or receive further medical testing or interventions if your symptoms are not deemed to be medically urgent (emergent). Tell a health care provider about: Any allergies you have. All medicines you are taking, including vitamins, herbs, eye drops, creams, and over-the-counter medicines. Any problems you or family members have had with anesthetic medicines. Any bleeding problems you have. Any surgeries you have had. Any medical conditions you have. Whether you are pregnant or may be pregnant. What happens during the test? During the exam, a health care provider does a short, often focused, physical exam and asks about your medical history to assess: Your  current symptoms. Your overall health. Your need for possible further medical intervention. What can I expect after the test? If you have a regular health care provider, make an appointment for a follow-up visit with him or her. If you do not have a regular health care provider, ask about resources in your community. Your medical screening exam may determine that: You do not need emergency treatment at this time. You need treatment right away. You need to be transferred to another medical center. This may happen if you need an emergent specialist or consultant that is not available at the medical center you are at. You need to have more tests. A medical specialist may be consulted if needed. Get help right away if: Your condition gets worse. You develop new or troubling symptoms before you see your health care provider. These symptoms may represent a serious problem that is an emergency. Do not wait to see if the symptoms will go away. Get medical help right away. Call your local emergency services  (911 in the [place] Do not drive yourself to the hospital. Summary A medical screening exam helps to determine whether you need medical treatment right away. This type of exam may be done in an emergency department, an urgent care setting, or your health care provider's office. During the exam, a health care provider does a short physical exam and asks about your current symptoms and overall health. Depending on the exam, more tests or therapies may be ordered. However, an MSE does not necessarily mean that you will have further medical testing if your symptoms are not deemed to be urgent. If you need further care that is not offered at your current medical center, you may need to be transferred to another facility. This information is not intended to replace advice given to you by your health care provider. Make sure you discuss any questions you have with your health care provider. Document Revised: 02/03/2021 Document Reviewed: 10/01/2020 Elsevier Patient Education  2024 [employer].

## 2024-03-20 NOTE — Assessment & Plan Note (Signed)
 Obesity (Central Adiposity)   He is engaging in a weight loss program with [facility] MD and attempting to exercise at least three days a week. The importance of diet and exercise in managing central adiposity was discussed. Continue the weight loss program with [facility] MD and encourage regular exercise, aiming for at least three days a week.

## 2024-03-20 NOTE — Progress Notes (Signed)
 [facility] at [facility] [address] [place], [place] [address] Office:  [phone]  -- Annual Preventive Medical Office Visit --  Patient:  Kevin Moon      Age: 38 y.o.       Sex:  male  Date:   03/20/2024 Patient Care Team: Jesus Bernardino MATSU, MD as PCP - General (Internal Medicine) Today's Healthcare Provider: Bernardino MATSU Jesus, MD  ========================================= Chief complaint: Annual Exam (Annual Exam. FYI of testosterone  therapy and new GLP1 (through [facility] MD))  Purpose of Visit: Comprehensive preventive health assessment and personalized health maintenance planning.  This encounter was conducted as a Comprehensive Physical Exam (CPE) preventive care annual visit. The patient's medical history and problem list were reviewed to inform individualized preventive care recommendations.   No problem-specific medical treatment was provided during this visit. Assessment & Plan Encounter for annual general medical examination with abnormal findings in adult The annual physical examination showed no cognitive impairment or dementia. Early cancer screening is not needed due to the absence of family history and insurance coverage limitations. There is no significant skin damage, and wheezing is likely due to pollen exposure. Perform the annual physical examination and conduct a Mini-Cog test for cognitive assessment  per patient request due to family history dementia. Advise nasal irrigation to reduce pollen exposure.  The importance of exercise, diet, and screenings was discussed. Basic lab work covered by [employer] for the annual physical is recommended. The potential benefits of comprehensive lab panels available directly from [facility] were discussed. Order basic lab work, including a general health panel, cholesterol, metabolic panel, blood count, and thyroid tests. Consider direct-to-consumer comprehensive lab panels from [facility] every 5-10 years. Mixed  hyperlipidemia He is on rosuvastatin  40 mg daily, with a family history of myocardial infarction as a significant risk factor. He is interested in discontinuing the statin due to his wife's concerns. Alternative options, including dietary changes and non-statin medications, were discussed. Avocado consumption is suggested. Bempedoic acid and Nexlizet are discussed as alternatives with fewer side effects. The non-family history risk is less than 1% over the next ten years. Consider discontinuing rosuvastatin . Prescribe bempedoic acid and Nexlizet if opting for non-statin medication. Encourage dietary changes, including a high protein, low fat, low carb diet, and increased avocado consumption. History of positive PPD Updated problem overview for this problem to improve longitudinal management  Central adiposity Obesity (Central Adiposity)   He is engaging in a weight loss program with [facility] MD and attempting to exercise at least three days a week. The importance of diet and exercise in managing central adiposity was discussed. Continue the weight loss program with [facility] MD and encourage regular exercise, aiming for at least three days a week. Suspected sleep apnea There is a family history of sleep apnea, and his wife reports snoring. The potential link between untreated sleep apnea and Alzheimer's type dementia was discussed. Recommend using an Apple Watch 10 or better for sleep apnea screening as a cost-effective alternative to a sleep study. Conductive hearing loss, bilateral Hearing loss is attributed to years of hunting without hearing protection, with high-pitched noises particularly affected.  Lab Results  Component Value Date   CHOL 146 03/20/2024   CHOL 137 10/26/2023   HDL 42.10 03/20/2024   HDL 54.50 10/26/2023   CHOLHDL 3 03/20/2024   CHOLHDL 3 10/26/2023   LDLCALC 62 03/20/2024   LDLCALC 63 10/26/2023   TRIG 209.0 (H) 03/20/2024   TRIG 97.0 10/26/2023   VLDL 41.8 (  H)  03/20/2024   VLDL 19.4 10/26/2023   The 89-bzjm ASCVD risk score (Arnett DK, et al., 2019) is: 0.9%   Values used to calculate the score:     Age: 37 years     Clincally relevant sex: Male     Is Non-Hispanic African American: No     Diabetic: No     Tobacco smoker: No     Systolic Blood Pressure: 144 mmHg     Is BP treated: No     HDL Cholesterol: 42.1 mg/dL     Total Cholesterol: 146 mg/dL    PRI-89-RF   1. Mixed hyperlipidemia  E78.2 Bempedoic Acid-Ezetimibe (NEXLIZET) 180-10 MG TABS    Lipid panel    2. Encounter for annual general medical examination with abnormal findings in adult  Z00.01 Bempedoic Acid-Ezetimibe (NEXLIZET) 180-10 MG TABS    Lipid panel    Comprehensive metabolic panel with GFR    CBC with Differential/Platelet    TSH Rfx on Abnormal to Free T4    3. History of positive PPD  Z92.89     4. Central adiposity  E65     5. Suspected sleep apnea  R29.818     6. Conductive hearing loss, bilateral  H90.0      Reviewed/updated/encouraged completion: Immunization History  Administered Date(s) Administered   Fluzone Influenza virus vaccine,trivalent (IIV3), split virus 05/19/2014   Influenza, Quadrivalent, Recombinant, Inj, Pf 05/27/2021   PFIZER(Purple Top)SARS-COV-2 Vaccination 07/15/2019, 08/12/2019, 05/11/2020   Health Maintenance Due  Topic Date Due   HIV Screening  Never done   Hepatitis C Screening  Never done   Hepatitis B Vaccines 19-59 Average Risk (1 of 3 - 19+ 3-dose series) Never done   HPV VACCINES (1 - 3-dose SCDM series) Never done   Influenza Vaccine  01/05/2024   Health Maintenance  Topic Date Due   HIV Screening  Never done   Hepatitis C Screening  Never done   Hepatitis B Vaccines 19-59 Average Risk (1 of 3 - 19+ 3-dose series) Never done   HPV VACCINES (1 - 3-dose SCDM series) Never done   Influenza Vaccine  01/05/2024   DTaP/Tdap/Td (1 - Tdap) 08/01/2024 (Originally 02/05/2003)   Pneumococcal Vaccine  Aged Out   Meningococcal B  Vaccine  Aged Out   COVID-19 Vaccine  Discontinued   Patient has had HIV Hepatitis B Virus (HBV) tesitng outside lab. We don't have on file   Reviewed the following verbally with patient and provided AVS materials:  HEALTH MAINTENANCE COUNSELING AND ANTICIPATORY GUIDANCE    Preventive Measure Recommendation  Eye Exams Every 1-2 years  Dental Care Cleanings every 6 months or more, brush/floss 3x daily  Sinus Care Saline spray rinses daily  Sleep 8 hours nightly, good sleep hygiene, e-monitoring if any daytime drowsiness  Diet Fruits/vegetables/fiber/healthy fats, balance and moderation  Exercise 150 minutes weekly  Risk Behaviors Discouraged any/all high risk behaviors    CANCER SCREENING SHARED DECISION MAKING    Penile/Testicle/Scrotum Encouraged self-monitoring and reporting of genital abnormalities. Patient reports none.  Thyroid Checked and advised to palpate thyroid for nodules  Prostate Individualized risks/benefits/costs discussed No results found for: PSA  Colon HM Colonoscopy   This patient has no relevant Health Maintenance data.     Lung Current guidelines recommend individuals aged 66 to 5 who currently smoke or formerly smoked and have a >= 20 pack-year smoking history should undergo annual screening with low-dose computed tomography (LDCT). Tobacco Use: Low Risk  (03/20/2024)  Patient History    Smoking Tobacco Use: Never    Smokeless Tobacco Use: Never    Passive Exposure: Not on file   Social History   Tobacco Use  Smoking Status Never  Smokeless Tobacco Never    Skin Advised regular sunscreen use. Patient denies worrisome, changing, or new skin lesions. Offered to include images in chart for surveillance. Showed patient these pictures of melanomas for reference to educate for self-monitoring.  Other Cancers Discussed lack of screening guidelines and insurance coverage for other cancer types.    Discussed the use of AI scribe software for clinical note  transcription with the patient, who gave verbal consent to proceed.  History of Present Illness 38 year old male who presents for a discussion on alternatives to statin therapy.  He is currently on rosuvastatin  40 mg for cholesterol management and is interested in exploring alternatives due to concerns from his wife about potential side effects. He has a family history of heart attacks, which is a significant factor in his current treatment plan. He reports a low personal risk of heart attack based on his cholesterol, weight, and age, with less than a one percent chance in the next ten years. He has made dietary changes, including a high protein, low fat, and low carb diet, and is incorporating as much fish as possible despite limited availability. He enjoys avocados, which he has heard may outperform statins in some cases. He is trying to increase his physical activity, aiming for at least three days a week at the gym.  He has a family history of Alzheimer's and Parkinson's disease, which concerns him. He is interested in early detection methods for Alzheimer's, particularly related to sleep apnea, as his mother has sleep apnea and uses a CPAP machine. He reports snoring, as noted by his wife.  He has a history of hearing loss attributed to years of hunting without hearing protection. He also has a baby tooth without an underlying adult tooth, which has been evaluated by a dentist. It does not cause pain or affect his eating, and he has opted not to pursue an implant due to cost.  No skin cancers and does not use sunblock regularly. He reports experiencing wheezing. He has bunions but is not interested in surgical intervention at this time.  ROS A comprehensive ROS was negative for any concerning symptoms.   Completed medication reconciliation: Current Outpatient Medications on File Prior to Visit  Medication Sig   celecoxib  (CELEBREX ) 200 MG capsule Take 1 capsule (200 mg total) by mouth 2 (two)  times daily.   ibuprofen (ADVIL) 200 MG tablet Take 200 mg by mouth every 6 (six) hours as needed for moderate pain (pain score 4-6).   ipratropium (ATROVENT ) 0.06 % nasal spray Place 2 sprays into the nose 3 (three) times daily as needed.   rosuvastatin  (CRESTOR ) 40 MG tablet Take 1 tablet (40 mg total) by mouth daily.   SYRINGE-NEEDLE, DISP, 3 ML 23G X 1 3 ML MISC Use as directed with weekly testosterone  injection   testosterone  cypionate (DEPOTESTOSTERONE CYPIONATE) 200 MG/ML injection Inject 1.1 mLs (220 mg total) into the muscle once a week.   vitamin C (ASCORBIC ACID) 500 MG tablet Take 1,000 mg by mouth daily.   diclofenac  Sodium (VOLTAREN ) 1 % GEL Apply 4 g topically 4 (four) times daily as needed. (Patient not taking: Reported on 03/20/2024)   pseudoephedrine (SUDAFED) 120 MG 12 hr tablet Take 120 mg by mouth every 12 (twelve) hours as needed  for congestion. (Patient not taking: Reported on 03/20/2024)   No current facility-administered medications on file prior to visit.  There are no discontinued medications.The following were reviewed and/or entered/updated into our electronic MEDICAL RECORD NUMBERPast Medical History:  Diagnosis Date   Allergies    Past Surgical History:  Procedure Laterality Date   ROTATOR CUFF REPAIR Right    Social History   Socioeconomic History   Marital status: Married    Spouse name: Not on file   Number of children: Not on file   Years of education: Not on file   Highest education level: Bachelor's degree (e.g., BA, AB, BS)  Occupational History   Not on file  Tobacco Use   Smoking status: Never   Smokeless tobacco: Never  Vaping Use   Vaping status: Never Used  Substance and Sexual Activity   Alcohol use: Yes    Alcohol/week: 6.0 standard drinks of alcohol    Types: 6 Shots of liquor per week   Drug use: Never   Sexual activity: Yes    Birth control/protection: None  Other Topics Concern   Not on file  Social History Narrative   Not on  file   Social Drivers of Health   Financial Resource Strain: Low Risk  (07/05/2023)   Overall Financial Resource Strain (CARDIA)    Difficulty of Paying Living Expenses: Not hard at all  Food Insecurity: No Food Insecurity (07/05/2023)   Hunger Vital Sign    Worried About Running Out of Food in the Last Year: Never true    Ran Out of Food in the Last Year: Never true  Transportation Needs: No Transportation Needs (07/05/2023)   PRAPARE - Administrator, Civil Service (Medical): No    Lack of Transportation (Non-Medical): No  Physical Activity: Sufficiently Active (07/05/2023)   Exercise Vital Sign    Days of Exercise per Week: 3 days    Minutes of Exercise per Session: 50 min  Stress: Stress Concern Present (07/05/2023)   [employer] of Occupational Health - Occupational Stress Questionnaire    Feeling of Stress : Rather much  Social Connections: Socially Integrated (07/05/2023)   Social Connection and Isolation Panel    Frequency of Communication with Friends and Family: More than three times a week    Frequency of Social Gatherings with Friends and Family: More than three times a week    Attends Religious Services: 1 to 4 times per year    Active Member of [employer] or Organizations: Yes    Attends Banker Meetings: 1 to 4 times per year    Marital Status: Married  Catering manager Violence: Not on file      07/05/2023    3:21 PM  Alcohol Use Disorder Test (AUDIT)  1. How often do you have a drink containing alcohol? 2  2. How many drinks containing alcohol do you have on a typical day when you are drinking? 1  3. How often do you have six or more drinks on one occasion? 1  AUDIT-C Score 4   4. How often during the last year have you found that you were not able to stop drinking once you had started? 0  5. How often during the last year have you failed to do what was normally expected from you because of drinking? 0  6. How often during the last year  have you needed a first drink in the morning to get yourself going after a heavy drinking session? 0  7. How often during the last year have you had a feeling of guilt of remorse after drinking? 0  8. How often during the last year have you been unable to remember what happened the night before because you had been drinking? 0  9. Have you or someone else been injured as a result of your drinking? 0  10. Has a relative or friend or a doctor or another health worker been concerned about your drinking or suggested you cut down? 0  Alcohol Use Disorder Identification Test Final Score (AUDIT) 4      Patient-reported   Family History  Problem Relation Age of Onset   Hyperlipidemia Mother    Hypertension Father    Hyperlipidemia Father    Hearing loss Father    Arthritis Father    Birth defects Daughter    Stroke Paternal Grandmother    Bladder Cancer Paternal Grandfather    Allergies  Allergen Reactions   Fluticasone      Other Reaction(s): did not like scent   Social History   Substance and Sexual Activity  Sexual Activity Yes   Birth control/protection: None  @    10/26/2023    8:07 AM  Depression screen PHQ 2/9  Decreased Interest 0  Down, Depressed, Hopeless 0  PHQ - 2 Score 0  Altered sleeping 0  Tired, decreased energy 0  Change in appetite 0  Feeling bad or failure about yourself  0  Trouble concentrating 0  Moving slowly or fidgety/restless 0  Suicidal thoughts 0  PHQ-9 Score 0  Difficult doing work/chores Not difficult at all      03/16/2023    9:27 AM  Fall Risk   Falls in the past year? 0  Number falls in past yr: 0  Injury with Fall? 0  Risk for fall due to : No Fall Risks  Follow up Falls evaluation completed     BP (!) 144/90   Pulse 79   Temp 98.4 F (36.9 C)   Ht 5' 10 (1.778 m)   Wt 269 lb 12.8 oz (122.4 kg)   SpO2 97%   BMI 38.71 kg/m  BP Readings from Last 3 Encounters:  03/20/24 (!) 144/90  10/26/23 120/72  07/05/23 135/82   Wt  Readings from Last 10 Encounters:  03/20/24 269 lb 12.8 oz (122.4 kg)  10/26/23 266 lb 12.8 oz (121 kg)  07/05/23 271 lb 9.6 oz (123.2 kg)  05/18/23 250 lb (113.4 kg)  03/16/23 258 lb 9.6 oz (117.3 kg)  09/09/21 239 lb (108.4 kg)  12/16/18 245 lb (111.1 kg)  Physical Exam  GEN: No acute distress, resting comfortably. HEENT: Tympanic membranes normal appearing bilaterally, oropharynx clear, no thyromegaly noted, no palpable lymphadenopathy or thyroid nodules.Normal oropharynx, tooth requires attention. CARDIOVASCULAR: S1 and S2 heart sounds with regular rate and rhythm, no murmurs appreciated. PULMONARY: Normal work of breathing, clear to auscultation bilaterally, no crackles,  or rhonchi.very very faint wheezing present. ABDOMEN: Soft, nontender, nondistended. MSK: No edema, cyanosis, or clubbing noted. SKIN: Warm, dry, no lesions of concern observed. NEUROLOGICAL: Cranial nerves II-XII grossly intact, strength 5/5 in upper and lower extremities, reflexes symmetric and intact bilaterally. PSYCH: Normal affect and thought content, pleasant and cooperative.  Last CBC Lab Results  Component Value Date   WBC 11.3 (H) 03/20/2024   HGB 16.4 03/20/2024   HCT 49.9 03/20/2024   MCV 89.9 03/20/2024   MCH 30.7 05/18/2023   RDW 13.8 03/20/2024   PLT 184.0 03/20/2024   Last  metabolic panel Lab Results  Component Value Date   GLUCOSE 91 03/20/2024   NA 138 03/20/2024   K 4.6 03/20/2024   CL 105 03/20/2024   CO2 26 03/20/2024   BUN 20 03/20/2024   CREATININE 1.23 03/20/2024   GFR 73.64 03/20/2024   CALCIUM  9.6 03/20/2024   PROT 7.0 03/20/2024   ALBUMIN 4.7 03/20/2024   BILITOT 0.5 03/20/2024   ALKPHOS 60 03/20/2024   AST 34 03/20/2024   ALT 16 03/20/2024   ANIONGAP 10 05/18/2023   Last lipids Lab Results  Component Value Date   CHOL 146 03/20/2024   HDL 42.10 03/20/2024   LDLCALC 62 03/20/2024   TRIG 209.0 (H) 03/20/2024   CHOLHDL 3 03/20/2024   Last hemoglobin A1c Lab  Results  Component Value Date   HGBA1C 5.4 10/26/2023   Last thyroid functions Lab Results  Component Value Date   TSH 0.94 03/16/2023   Last vitamin D No results found for: 25OHVITD2, 25OHVITD3, VD25OH Last vitamin B12 and Folate No results found for: VITAMINB12, FOLATE      ======================================  IMPORTANT HEALTH REMINDERS: Report any new or changing skin lesions promptly Maintain recommended screening schedules Discuss any new family history of cancer at future visits Follow up on any new symptoms that persist more than two weeks      Notes:  This document was synthesized by artificial intelligence ([employer]) using HIPAA-compliant recording of the clinical interaction;   We discussed the use of AI scribe software for clinical note transcription with the patient, who gave verbal consent to proceed.    This encounter employed state-of-the-art, real-time, collaborative documentation. The patient was empowered to actively review and assist in updating their electronic medical record on a shared monitor, ensuring transparency and improving accuracy.    Prior to and at the beginning of Comprehensive Physical Exam (CPE) preventive care annual visit appointment types  we clarify to patients Our goal today is to focus on your preventive or annual Comprehensive Physical Exam (CPE) preventive care annual visit, which typically covers routine screenings and overall health maintenance. However, if you share any new or concerning symptoms--such as dizziness, passing out, severe pain, or anything else that may point to a more serious issue--we are both legally and ethically required to evaluate it. We cannot simply overlook or ignore such concerns, even if you later decide you don't want to discuss them, because it could jeopardize your health.  If addressing a new concern takes us  beyond the scope of the preventive visit, we may need to bill separately for that  portion of care. We understand financial considerations are important, and we're happy to discuss your options if something new comes up. However, we want to be clear that once you mention a potentially serious issue, we must investigate it; we can't ethically or legally exclude that from our records or our evaluation. Please let us  know all of your questions or worries. Together, we can decide how best to manage them and how to minimize any unexpected costs, but we want to keep you safe above all else.   This disclosure is mandated by professional ethics and legal obligations, as healthcare providers must address any substantial health concerns raised during any patient interaction and a comprehensive ROS is required by insurance companies for billing preventive-care visit type.   This disclosure ultimately discourages patients financially from reporting significant health issues.   Medical Screening Exam A medical screening exam (MSE) helps to determine whether you need immediate medical treatment  relating to any number of symptoms you are having. This type of exam may be done in an emergency department, an urgent care setting, or your health care provider's office. Depending on your symptoms and severity, you may need additional tests or medical therapy. It is important to note that an MSE does not necessarily mean that you will need or receive further medical testing or interventions if your symptoms are not deemed to be medically urgent (emergent). Tell a health care provider about: Any allergies you have. All medicines you are taking, including vitamins, herbs, eye drops, creams, and over-the-counter medicines. Any problems you or family members have had with anesthetic medicines. Any bleeding problems you have. Any surgeries you have had. Any medical conditions you have. Whether you are pregnant or may be pregnant. What happens during the test? During the exam, a health care provider does a  short, often focused, physical exam and asks about your medical history to assess: Your current symptoms. Your overall health. Your need for possible further medical intervention. What can I expect after the test? If you have a regular health care provider, make an appointment for a follow-up visit with him or her. If you do not have a regular health care provider, ask about resources in your community. Your medical screening exam may determine that: You do not need emergency treatment at this time. You need treatment right away. You need to be transferred to another medical center. This may happen if you need an emergent specialist or consultant that is not available at the medical center you are at. You need to have more tests. A medical specialist may be consulted if needed. Get help right away if: Your condition gets worse. You develop new or troubling symptoms before you see your health care provider. These symptoms may represent a serious problem that is an emergency. Do not wait to see if the symptoms will go away. Get medical help right away. Call your local emergency services (911 in the [place] Do not drive yourself to the hospital. Summary A medical screening exam helps to determine whether you need medical treatment right away. This type of exam may be done in an emergency department, an urgent care setting, or your health care provider's office. During the exam, a health care provider does a short physical exam and asks about your current symptoms and overall health. Depending on the exam, more tests or therapies may be ordered. However, an MSE does not necessarily mean that you will have further medical testing if your symptoms are not deemed to be urgent. If you need further care that is not offered at your current medical center, you may need to be transferred to another facility. This information is not intended to replace advice given to you by your health care provider. Make  sure you discuss any questions you have with your health care provider. Document Revised: 02/03/2021 Document Reviewed: 10/01/2020 Elsevier Patient Education  2024 Elsevier Inc.   Health Maintenance, Male Adopting a healthy lifestyle and getting preventive care are important in promoting health and wellness. Ask your health care provider about: The right schedule for you to have regular tests and exams. Things you can do on your own to prevent diseases and keep yourself healthy. What should I know about diet, weight, and exercise? Eat a healthy diet  Eat a diet that includes plenty of vegetables, fruits, low-fat dairy products, and lean protein. Do not eat a lot of foods that are high in solid fats,  added sugars, or sodium. Maintain a healthy weight Body mass index (BMI) is a measurement that can be used to identify possible weight problems. It estimates body fat based on height and weight. Your health care provider can help determine your BMI and help you achieve or maintain a healthy weight. Get regular exercise Get regular exercise. This is one of the most important things you can do for your health. Most adults should: Exercise for at least 150 minutes each week. The exercise should increase your heart rate and make you sweat (moderate-intensity exercise). Do strengthening exercises at least twice a week. This is in addition to the moderate-intensity exercise. Spend less time sitting. Even light physical activity can be beneficial. Watch cholesterol and blood lipids Have your blood tested for lipids and cholesterol at 40 years of age, then have this test every 5 years. You may need to have your cholesterol levels checked more often if: Your lipid or cholesterol levels are high. You are older than 40 years of age. You are at high risk for heart disease. What should I know about cancer screening? Many types of cancers can be detected early and may often be prevented. Depending on your  health history and family history, you may need to have cancer screening at various ages. This may include screening for: Colorectal cancer. Prostate cancer. Skin cancer. Lung cancer. What should I know about heart disease, diabetes, and high blood pressure? Blood pressure and heart disease High blood pressure causes heart disease and increases the risk of stroke. This is more likely to develop in people who have high blood pressure readings or are overweight. Talk with your health care provider about your target blood pressure readings. Have your blood pressure checked: Every 3-5 years if you are 2-42 years of age. Every year if you are 37 years old or older. If you are between the ages of 4 and 55 and are a current or former smoker, ask your health care provider if you should have a one-time screening for abdominal aortic aneurysm (AAA). Diabetes Have regular diabetes screenings. This checks your fasting blood sugar level. Have the screening done: Once every three years after age 3 if you are at a normal weight and have a low risk for diabetes. More often and at a younger age if you are overweight or have a high risk for diabetes. What should I know about preventing infection? Hepatitis B If you have a higher risk for hepatitis B, you should be screened for this virus. Talk with your health care provider to find out if you are at risk for hepatitis B infection. Hepatitis C Blood testing is recommended for: Everyone born from [birth year] through 1967. Anyone with known risk factors for hepatitis C. Sexually transmitted infections (STIs) You should be screened each year for STIs, including gonorrhea and chlamydia, if: You are sexually active and are younger than 40 years of age. You are older than 40 years of age and your health care provider tells you that you are at risk for this type of infection. Your sexual activity has changed since you were last screened, and you are at increased risk  for chlamydia or gonorrhea. Ask your health care provider if you are at risk. Ask your health care provider about whether you are at high risk for HIV. Your health care provider may recommend a prescription medicine to help prevent HIV infection. If you choose to take medicine to prevent HIV, you should first get tested for HIV. You  should then be tested every 3 months for as long as you are taking the medicine. Follow these instructions at home: Alcohol use Do not drink alcohol if your health care provider tells you not to drink. If you drink alcohol: Limit how much you have to 0-2 drinks a day. Know how much alcohol is in your drink. In the [place] one drink equals one 12 oz bottle of beer (355 mL), one 5 oz glass of wine (148 mL), or one 1 oz glass of hard liquor (44 mL). Lifestyle Do not use any products that contain nicotine or tobacco. These products include cigarettes, chewing tobacco, and vaping devices, such as e-cigarettes. If you need help quitting, ask your health care provider. Do not use street drugs. Do not share needles. Ask your health care provider for help if you need support or information about quitting drugs. General instructions Schedule regular health, dental, and eye exams. Stay current with your vaccines. Tell your health care provider if: You often feel depressed. You have ever been abused or do not feel safe at home. Summary Adopting a healthy lifestyle and getting preventive care are important in promoting health and wellness. Follow your health care provider's instructions about healthy diet, exercising, and getting tested or screened for diseases. Follow your health care provider's instructions on monitoring your cholesterol and blood pressure. This information is not intended to replace advice given to you by your health care provider. Make sure you discuss any questions you have with your health care provider. Document Revised: 10/12/2020 Document Reviewed:  10/12/2020 Elsevier Patient Education  2024 [employer].

## 2024-03-20 NOTE — Assessment & Plan Note (Addendum)
 He is on rosuvastatin  40 mg daily, with a family history of myocardial infarction as a significant risk factor. He is interested in discontinuing the statin due to his wife's concerns. Alternative options, including dietary changes and non-statin medications, were discussed. Avocado consumption is suggested. Bempedoic acid and Nexlizet are discussed as alternatives with fewer side effects. The non-family history risk is less than 1% over the next ten years. Consider discontinuing rosuvastatin . Prescribe bempedoic acid and Nexlizet if opting for non-statin medication. Encourage dietary changes, including a high protein, low fat, low carb diet, and increased avocado consumption.

## 2024-03-21 ENCOUNTER — Ambulatory Visit: Payer: Self-pay | Admitting: Internal Medicine

## 2024-03-21 ENCOUNTER — Other Ambulatory Visit (HOSPITAL_COMMUNITY): Payer: Self-pay

## 2024-03-21 ENCOUNTER — Other Ambulatory Visit (HOSPITAL_BASED_OUTPATIENT_CLINIC_OR_DEPARTMENT_OTHER): Payer: Self-pay

## 2024-03-21 ENCOUNTER — Telehealth: Payer: Self-pay

## 2024-03-21 LAB — TSH RFX ON ABNORMAL TO FREE T4: TSH: 1.93 u[IU]/mL (ref 0.450–4.500)

## 2024-03-21 NOTE — Telephone Encounter (Signed)
 Pharmacy Patient Advocate Encounter   Received notification from [employer] that prior authorization for Nexlizet 180-10MG  tablets  is required/requested.   Insurance verification completed.   The patient is insured through [employer].   Per test claim: PA required; PA started via CoverMyMeds. KEY [id number] . Please see clinical question(s) below that I am not finding the answer to in their chart and advise.

## 2024-04-08 ENCOUNTER — Other Ambulatory Visit (HOSPITAL_BASED_OUTPATIENT_CLINIC_OR_DEPARTMENT_OTHER): Payer: Self-pay

## 2024-04-15 ENCOUNTER — Other Ambulatory Visit (HOSPITAL_BASED_OUTPATIENT_CLINIC_OR_DEPARTMENT_OTHER): Payer: Self-pay

## 2024-04-17 ENCOUNTER — Other Ambulatory Visit (HOSPITAL_BASED_OUTPATIENT_CLINIC_OR_DEPARTMENT_OTHER): Payer: Self-pay

## 2024-04-22 ENCOUNTER — Telehealth: Admitting: Physician Assistant

## 2024-04-22 DIAGNOSIS — U071 COVID-19: Secondary | ICD-10-CM

## 2024-04-22 MED ORDER — PROMETHAZINE-DM 6.25-15 MG/5ML PO SYRP
5.0000 mL | ORAL_SOLUTION | Freq: Four times a day (QID) | ORAL | 0 refills | Status: AC | PRN
  Filled 2024-04-22 – 2024-04-23 (×2): qty 118, 6d supply, fill #0

## 2024-04-22 MED ORDER — NAPROXEN 500 MG PO TABS
500.0000 mg | ORAL_TABLET | Freq: Two times a day (BID) | ORAL | 0 refills | Status: AC
  Filled 2024-04-22 – 2024-04-23 (×2): qty 30, 15d supply, fill #0

## 2024-04-22 MED ORDER — TRIAMCINOLONE ACETONIDE 55 MCG/ACT NA AERO
2.0000 | INHALATION_SPRAY | Freq: Every day | NASAL | 12 refills | Status: AC
  Filled 2024-04-22: qty 1, 1d supply, fill #0

## 2024-04-22 NOTE — Progress Notes (Signed)
 Your test for COVID-19 was positive, meaning that you were infected with the novel coronavirus and could give the germ to others.    Most people with these infections have a mild illness and can recover at home without medical care. Do not leave your home, except to get medical care.  DO not visit public areas and do not go to places where you are unable to wear a mask. It is important for you to stay home to take care of yourself and to help protect other people in your home and community.   Isolation Instructions:  You are to isolate at home for now until you have taken your home COVID/Flu test and notified our team of your results, at which time further isolation instructions will be given.  If you must be around other household members who do not have symptoms, you need to make sure that both you and the family members are masking consistently with a high-quality mask, even while in the home.  If you note any worsening of symptoms despite treatment, please seek an IN-PERSON evaluation ASAP. If you note any significant shortness of breath or any chest pain, please seek immediate ER evaluation. Please do not delay care!  Go to the nearest hospital ED for assessment if fever/cough/breathlessness are severe or illness seems like a threat to life.    The following symptoms may appear 2-14 days after exposure: Fever Cough Shortness of breath or difficulty breathing Chills Repeated shaking with chills Muscle pain Headache Sore throat New loss of taste or smell Fatigue Congestion or runny nose Nausea or vomiting Diarrhea   For symptoms,  I have prescribed Phenergan  DM 6.25 mg/15 mg. You make take one teaspoon / 5 ml every 4-6 hours as needed for cough, I have prescribed an anti-inflammatory - Naprosyn 500 mg. Take twice daily as needed for fever or body aches for 2 weeks, and I have prescribed Fluticasone  nasal spray 2 sprays in each nostril one time per dayasal spray 2 sprays in each  nostril one time per day You may also take acetaminophen  (Tylenol ) as needed for fever.   Reduce your risk of any infection by using the same precautions used for avoiding the common cold or flu:  Wash your hands often with soap and warm water for at least 20 seconds.  If soap and water are not readily available, use an alcohol-based hand sanitizer with at least 60% alcohol.  If coughing or sneezing, cover your mouth and nose by coughing or sneezing into the elbow areas of your shirt or coat, into a tissue or into your sleeve (not your hands). Avoid shaking hands with others and consider head nods or verbal greetings only. Avoid touching your eyes, nose, or mouth with unwashed hands.  Avoid close contact with people who are sick. Avoid places or events with large numbers of people in one location, like concerts or sporting events. Carefully consider travel plans you have or are making. If you are planning any travel outside or inside the [place] , visit the CDC's Travelers' Health webpage for the latest health notices. If you have some symptoms but not all symptoms, continue to monitor at home and seek medical attention if your symptoms worsen. If you are having a medical emergency, call 911.  HOME CARE Only take medications as instructed by your medical team. Drink plenty of fluids and get plenty of rest. A steam or ultrasonic humidifier can help if you have congestion.   GET HELP RIGHT AWAY IF  YOU HAVE EMERGENCY WARNING SIGNS** FOR COVID-19. If you or someone is showing any of these signs seek emergency medical care immediately. Call 911 or proceed to your closest emergency facility if: You develop worsening high fever. Trouble breathing. Bluish lips or face. Persistent pain or pressure in the chest. New confusion. Inability to wake or stay awake. You cough up blood. Your symptoms become more severe.  **This list is not all possible symptoms. Contact your medical provider for any symptoms  that are sever or concerning to you.   MAKE SURE YOU  Understand these instructions. Will watch your condition. Will get help right away if you are not doing well or get worse.  Your e-visit answers were reviewed by a board certified advanced clinical practitioner to complete your personal care plan.  Depending on the condition, your plan could have included both over the counter or prescription medications.  If there is a problem, please reply once you have received a response from your provider.  Your safety is important to us .  If you have drug allergies check your prescription carefully.    You can use MyChart to ask questions about today's visit, request a non-urgent call back, or ask for a work or school excuse for 24 hours related to this e-Visit. If it has been greater than 24 hours you will need to follow up with your provider, or enter a new e-Visit to address those concerns. You will get an e-mail in the next two days asking about your experience.  I hope that your e-visit has been valuable and will speed your recovery. Thank you for using e-visits.   I have spent 5 minutes in review of e-visit questionnaire, review and updating patient chart, medical decision making and response to patient.   Delon CHRISTELLA Dickinson, PA-C

## 2024-04-23 ENCOUNTER — Other Ambulatory Visit (HOSPITAL_BASED_OUTPATIENT_CLINIC_OR_DEPARTMENT_OTHER): Payer: Self-pay

## 2024-04-23 ENCOUNTER — Other Ambulatory Visit: Payer: Self-pay

## 2024-04-25 ENCOUNTER — Other Ambulatory Visit (HOSPITAL_BASED_OUTPATIENT_CLINIC_OR_DEPARTMENT_OTHER): Payer: Self-pay

## 2024-04-29 ENCOUNTER — Other Ambulatory Visit (HOSPITAL_BASED_OUTPATIENT_CLINIC_OR_DEPARTMENT_OTHER): Payer: Self-pay

## 2024-05-01 ENCOUNTER — Other Ambulatory Visit (HOSPITAL_BASED_OUTPATIENT_CLINIC_OR_DEPARTMENT_OTHER): Payer: Self-pay

## 2024-05-06 ENCOUNTER — Other Ambulatory Visit (HOSPITAL_BASED_OUTPATIENT_CLINIC_OR_DEPARTMENT_OTHER): Payer: Self-pay

## 2024-06-11 ENCOUNTER — Other Ambulatory Visit (HOSPITAL_BASED_OUTPATIENT_CLINIC_OR_DEPARTMENT_OTHER): Payer: Self-pay

## 2024-06-11 MED ORDER — TESTOSTERONE CYPIONATE 200 MG/ML IM SOLN
220.0000 mg | INTRAMUSCULAR | 0 refills | Status: AC
  Filled 2024-06-11: qty 8, 28d supply, fill #0

## 2025-03-25 ENCOUNTER — Encounter: Admitting: Internal Medicine
